# Patient Record
Sex: Male | Born: 1945 | Race: White | Hispanic: No | Marital: Single | Smoking: Former smoker
Health system: Southern US, Community
[De-identification: ages and names within clinical notes are randomized; demographics above are authoritative.]

## PROBLEM LIST (undated history)

## (undated) DIAGNOSIS — Z9289 Personal history of other medical treatment: Secondary | ICD-10-CM

## (undated) DIAGNOSIS — E7800 Pure hypercholesterolemia, unspecified: Secondary | ICD-10-CM

## (undated) DIAGNOSIS — N400 Enlarged prostate without lower urinary tract symptoms: Secondary | ICD-10-CM

## (undated) DIAGNOSIS — E669 Obesity, unspecified: Secondary | ICD-10-CM

## (undated) DIAGNOSIS — E119 Type 2 diabetes mellitus without complications: Secondary | ICD-10-CM

## (undated) DIAGNOSIS — I10 Essential (primary) hypertension: Secondary | ICD-10-CM

## (undated) DIAGNOSIS — I2510 Atherosclerotic heart disease of native coronary artery without angina pectoris: Secondary | ICD-10-CM

## (undated) DIAGNOSIS — I209 Angina pectoris, unspecified: Secondary | ICD-10-CM

## (undated) DIAGNOSIS — M1990 Unspecified osteoarthritis, unspecified site: Secondary | ICD-10-CM

## (undated) DIAGNOSIS — E785 Hyperlipidemia, unspecified: Secondary | ICD-10-CM

## (undated) HISTORY — DX: Essential (primary) hypertension: I10

## (undated) HISTORY — PX: HEMORRHOID SURGERY: SHX153

## (undated) HISTORY — PX: ANKLE FRACTURE SURGERY: SHX122

## (undated) HISTORY — DX: Personal history of other medical treatment: Z92.89

## (undated) HISTORY — DX: Hyperlipidemia, unspecified: E78.5

## (undated) HISTORY — DX: Atherosclerotic heart disease of native coronary artery without angina pectoris: I25.10

---

## 2005-05-05 ENCOUNTER — Emergency Department: Payer: Self-pay | Admitting: Emergency Medicine

## 2008-09-09 ENCOUNTER — Ambulatory Visit: Payer: Self-pay | Admitting: Urology

## 2008-11-26 ENCOUNTER — Inpatient Hospital Stay: Payer: Self-pay | Admitting: Orthopedic Surgery

## 2008-12-27 HISTORY — PX: LEG SURGERY: SHX1003

## 2010-11-25 ENCOUNTER — Ambulatory Visit: Payer: Self-pay | Admitting: Urology

## 2010-12-11 ENCOUNTER — Ambulatory Visit: Payer: Self-pay | Admitting: Urology

## 2010-12-31 ENCOUNTER — Ambulatory Visit: Payer: Self-pay | Admitting: Urology

## 2011-01-07 ENCOUNTER — Ambulatory Visit: Payer: Self-pay | Admitting: Urology

## 2011-01-18 ENCOUNTER — Ambulatory Visit: Payer: Self-pay | Admitting: Urology

## 2011-05-17 ENCOUNTER — Ambulatory Visit: Payer: Self-pay | Admitting: General Practice

## 2011-05-25 ENCOUNTER — Ambulatory Visit: Payer: Self-pay | Admitting: Cardiology

## 2011-06-08 ENCOUNTER — Ambulatory Visit
Admission: RE | Admit: 2011-06-08 | Discharge: 2011-06-08 | Disposition: A | Discharge status: Home / Self Care | Payer: BC Managed Care – PPO | Source: Ambulatory Visit | Attending: Cardiovascular Disease | Admitting: Cardiovascular Disease

## 2011-06-08 ENCOUNTER — Other Ambulatory Visit: Payer: Self-pay | Admitting: Cardiovascular Disease

## 2011-06-14 ENCOUNTER — Ambulatory Visit (HOSPITAL_COMMUNITY)
Admission: RE | Admit: 2011-06-14 | Discharge: 2011-06-15 | Disposition: A | Discharge status: Home / Self Care | Payer: BC Managed Care – PPO | Source: Ambulatory Visit | Attending: Cardiovascular Disease | Admitting: Cardiovascular Disease

## 2011-06-14 DIAGNOSIS — I2510 Atherosclerotic heart disease of native coronary artery without angina pectoris: Secondary | ICD-10-CM | POA: Insufficient documentation

## 2011-06-14 DIAGNOSIS — I2582 Chronic total occlusion of coronary artery: Secondary | ICD-10-CM | POA: Insufficient documentation

## 2011-06-14 DIAGNOSIS — I209 Angina pectoris, unspecified: Secondary | ICD-10-CM | POA: Insufficient documentation

## 2011-06-14 HISTORY — PX: CORONARY ANGIOPLASTY WITH STENT PLACEMENT: SHX49

## 2011-06-15 LAB — BASIC METABOLIC PANEL
BUN: 11 mg/dL (ref 6–23)
CO2: 30 mEq/L (ref 19–32)
Calcium: 8.8 mg/dL (ref 8.4–10.5)
Chloride: 105 mEq/L (ref 96–112)
Creatinine, Ser: 0.95 mg/dL (ref 0.50–1.35)

## 2011-06-15 LAB — CBC
HCT: 38.1 % — ABNORMAL LOW (ref 39.0–52.0)
MCH: 32 pg (ref 26.0–34.0)
MCV: 90.9 fL (ref 78.0–100.0)
Platelets: 190 10*3/uL (ref 150–400)
RBC: 4.19 MIL/uL — ABNORMAL LOW (ref 4.22–5.81)
RDW: 12.6 % (ref 11.5–15.5)

## 2011-06-22 NOTE — Discharge Summary (Signed)
  NAME:  TABARI, VOLKERT NO.:  [id number]  MEDICAL RECORD NO.:  [id number]  LOCATION:  6523                         FACILITY:  [facility]  PHYSICIAN:  Nanetta Batty, M.D.   DATE OF BIRTH:  August 23, 1946  DATE OF ADMISSION:  06/14/2011 DATE OF DISCHARGE:  06/15/2011                              DISCHARGE SUMMARY   DISCHARGE DIAGNOSES: 1. Coronary artery disease status post percutaneous coronary     intervention with chronically occluded left anterior descending     coronary artery with Resolute Integrity stent. 2. Chest pain, resolved, negative EKG changes. 3. Dyslipidemia.  HOSPITAL COURSE:  Mr. Kovacevic is a 65 year old Caucasian male with a history of coronary artery disease with a chronically occluded mid LAD, which was unable to be crossed by Dr. Juliann Pares.  He is set up for percutaneous coronary intervention on June 14, 2011, for an attempt at stenting to the LAD.  This was completed successfully with a Resolute Integrity stent.  The 100% stenosis was reduced to 0%.  The patient will be sent home with aspirin and additional Plavix and follow up with Dr. Lady Gary at the [facility].  DISCHARGE LABS:  WBC 7.2, hemoglobin 13.4, hematocrit 38.1, platelets 190.  Sodium 140, potassium 4.3, chloride 105, carbon dioxide 30, BUN 11, creatinine 0.95, glucose 117, calcium 8.8.  STUDIES/PROCEDURES:  Cardiac catheterization on June 14, 2011, successful percutaneous coronary intervention and stenting of the left anterior descending CTO with chronic stable angina using a drug-eluting stent.  The 100% stenosis was reduced to 0%.  DISCHARGE MEDICATIONS: 1. Plavix 75 mg 1 tab by mouth daily with a meal. 2. Aspirin enteric-coated 325 mg 1 tab by mouth daily. 3. CoQ10 over the counter 1 capsule by mouth daily. 4. Garlic over the counter 1 capsule by mouth daily. 5. Lisinopril 5 mg 1 tab by mouth daily. 6. Metoprolol XL succinate 25 mg 1 tab by mouth daily. 7. Multivitamins 1 tab  by mouth daily. 8. Simvastatin 20 mg 1 tab by mouth daily. 9. Saw palmetto over the counter 1 capsule by mouth daily. 10.Vitamin B 100 mg 1 tab by mouth daily.  DISPOSITION:  Mr. Mabey was discharged home in stable condition.  He was recommended to increase his activity slowly.  May shower and bathe.  No lifting for 3 days greater than 10 pounds, no driving for 3 days.  He was recommended to eat a low-sodium, heart-healthy diet.  If the catheter site becomes red, painful, swollen, or discharges fluid or pus, he is to call our office at [phone].  He will follow up with Dr. Lady Gary at the [facility] he has been instructed to call for an appointment time in approximately 1-2 weeks.    ______________________________ Wilburt Finlay, PA   ______________________________ Nanetta Batty, M.D.    BH/MEDQ  D:  06/15/2011  T:  06/16/2011  Job:  [id number]  cc:   Harold Hedge, MD  Electronically Signed by Wilburt Finlay PA on 06/17/2011 03:18:25 PM Electronically Signed by Nanetta Batty M.D. on 06/22/2011 09:23:13 AM

## 2011-06-22 NOTE — Cardiovascular Report (Signed)
NAME:  Darrell Jennings, Darrell Jennings NO.:  [id number]  MEDICAL RECORD NO.:  [id number]  LOCATION:  6523                         FACILITY:  [facility]  PHYSICIAN:  Nanetta Batty, M.D.   DATE OF BIRTH:  [birth year]  DATE OF PROCEDURE:  06/14/2011 DATE OF DISCHARGE:                           CARDIAC CATHETERIZATION   Darrell Jennings is a 65 year old mildly overweight divorced Caucasian male, father of one, grandfather of 3 grandchildren who was referred through the courtesy of Dr. Lady Gary for attempted PCI and stenting of the LAD chronic total occlusion with chronic stable angina, which failed one attempt at closing recently.  DESCRIPTION OF PROCEDURE:  The patient was brought to the Second Floor [facility] Cardiac Cath Lab in a postabsorptive state.  He was premedicated with p.o. Valium and IV fentanyl.  His right groin was prepped and shaved in usual sterile fashion.  Xylocaine 1% was used for local anesthesia.  A 7-French sheath was inserted into the right femoral artery using standard Seldinger technique.  A 5-French JR-4 was used to image the right coronary artery and establish the extent of right-to- left collateralization.  Visipaque dye was used throughout the entirety of the case.  Retrograde aortic pressures were monitored during the case.  Blood pressures are running in the 120/75 range.  Total of 195 mL was used during the case.  The patient received four baby aspirin, Plavix 600 mg p.o., and Pepcid intravenously.  The ACT was 495 after Angiomax bolus.  Using a 7-French XB LAD 3.5 along with an 0.14 x 190 Choice PT 2 guidewire and a 1.5 x 8-mm long pressure apex balloon, the lesion was crossed with moderate difficulty.  I established the wire, it was intraluminal with a prolonged injection showing that it indeed was in the LAD beyond the CTO.  Following this, I performed multiple inflations using the 158 pressure and then upgraded to a 20/15 track.  Following this, 2.5 x 18  Resolute stent was then deployed and positioned under strict angiographic fluoroscopic control.  It was deployed at 16 atmospheres.  Postdilatation was performed with a 30/15 [place] track at 16 atmospheres resulting in reduction of a total occlusion to 0% residual without TIMI 3 flow.  The patient did receive 200 mcg of intracoronary nitroglycerin.  There were no dissections.  He remained hemodynamically and electrocardiographically stable throughout the case.  IMPRESSION:  Successful percutaneous coronary intervention and stenting of the left anterior descending CTO with chronic stable angina using arrest with drug-eluting stent, aspirin, and Plavix.  The patient tolerated the procedure well.  The sheath will be removed in several hours, so he will remain recumbent for 6 hours thereafter and will be gently hydrated overnight.  He will discharged on the morning and will follow up with Dr. Lady Gary at [facility].  He left the lab in a stable condition. Dr. Lady Gary was notified of these results.     Nanetta Batty, M.D.     JB/MEDQ  D:  06/14/2011  T:  06/15/2011  Job:  [id number]  cc:   Second [facility] Cardiac [facility] Lab [facility] Harold Hedge, MD Jabier Mutton, MD  Electronically Signed by  Nanetta Batty M.D. on 06/22/2011 09:23:10 AM

## 2011-06-26 IMAGING — CT CT STONE STUDY
1 of 2 series · 15 of 32 positions shown, 19 images · non-contrast
Comparison: none

REASON FOR EXAM: COMMENTS:

PROCEDURE:     KREGER - KREGER ABDOMEN/PELVIS WO ( STONE)  - December 31, 2010  [DATE]
RESULT:      Comparison: None
TECHNIQUE: Multiple axial images from the lung bases to the symphysis pubis
were obtained without oral and without intravenous contrast.

[Series 2: soft tissue · axial · 0.84mm/px · z∈[-185,+250]mm · 15 of 159 slices shown, 19 images]
[im 7/159  soft-tissue]
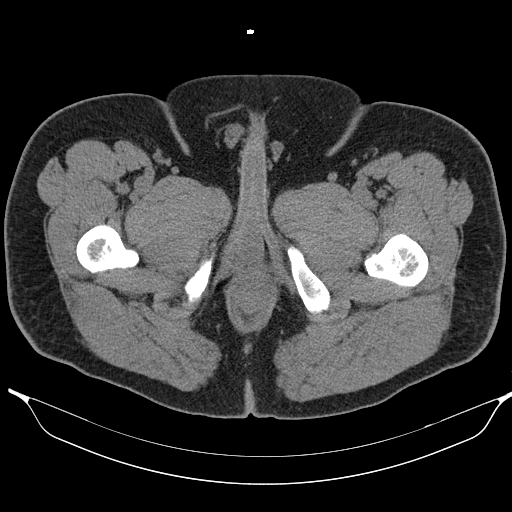
[im 7/159  bone]
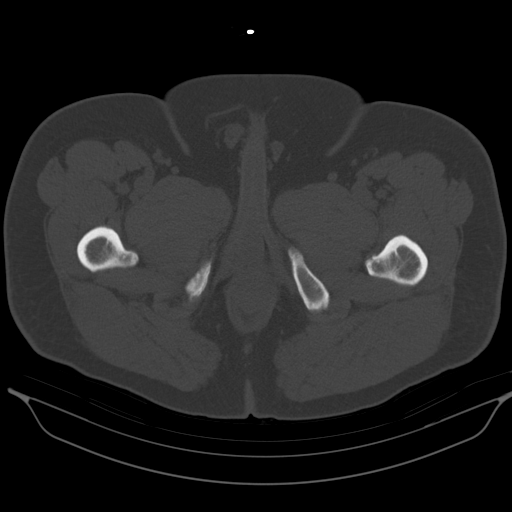
[im 21/159  soft-tissue]
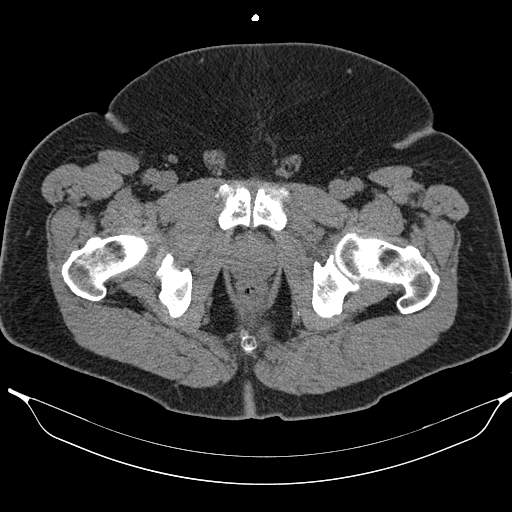
[im 35/159  soft-tissue]
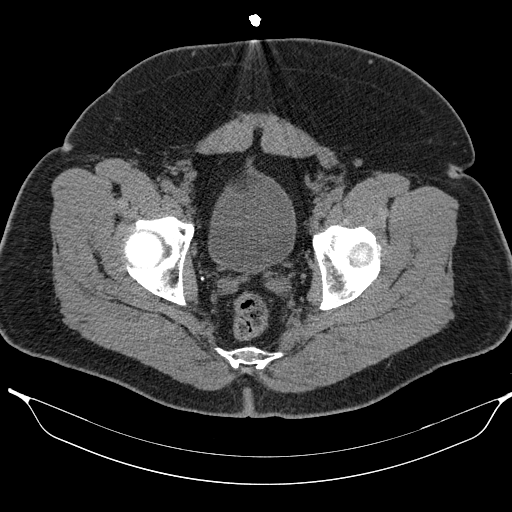
[im 42/159  soft-tissue]
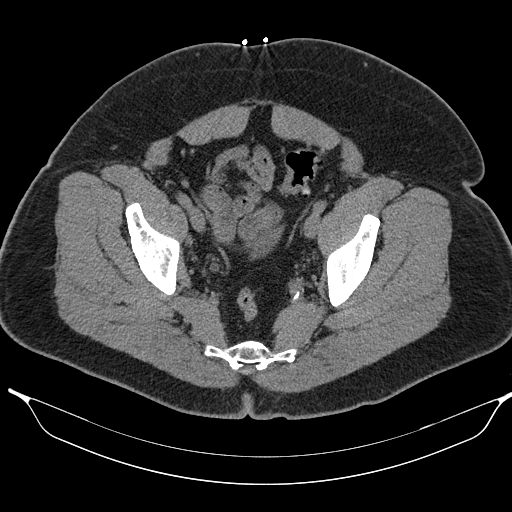
[im 55/159  soft-tissue]
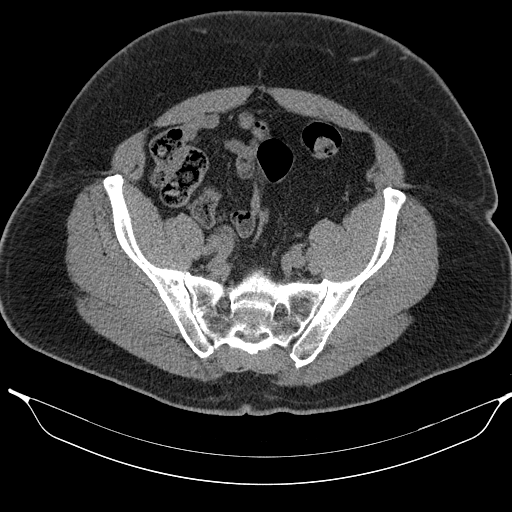
[im 69/159  soft-tissue]
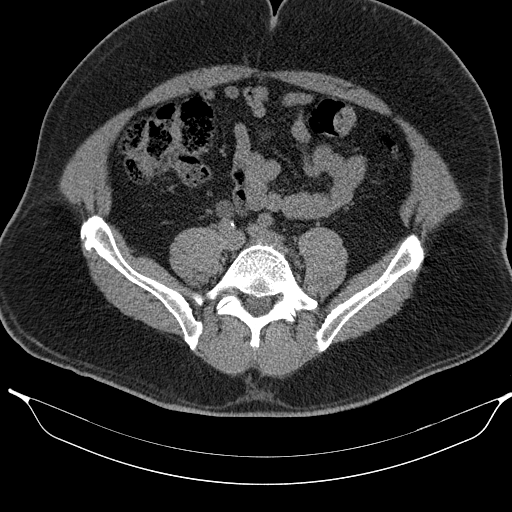
[im 83/159  soft-tissue]
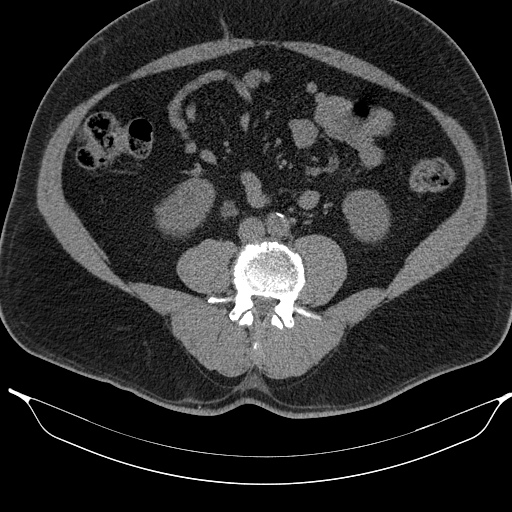
[im 90/159  soft-tissue]
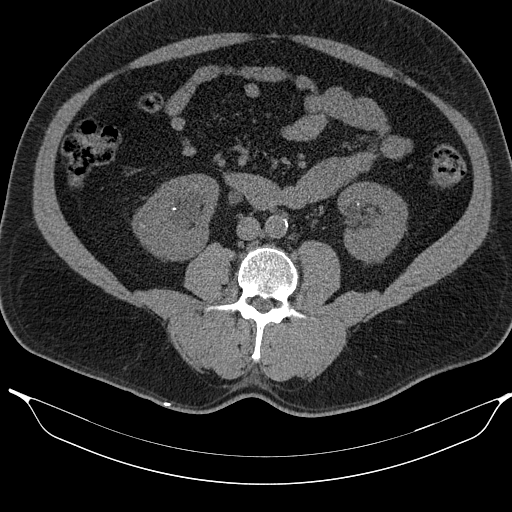
[im 104/159  soft-tissue]
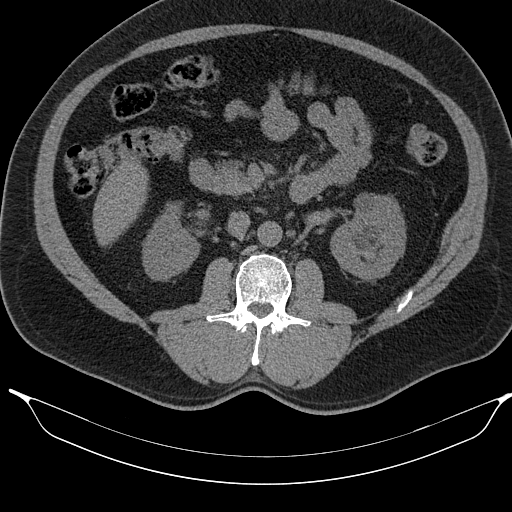
[im 104/159  bone]
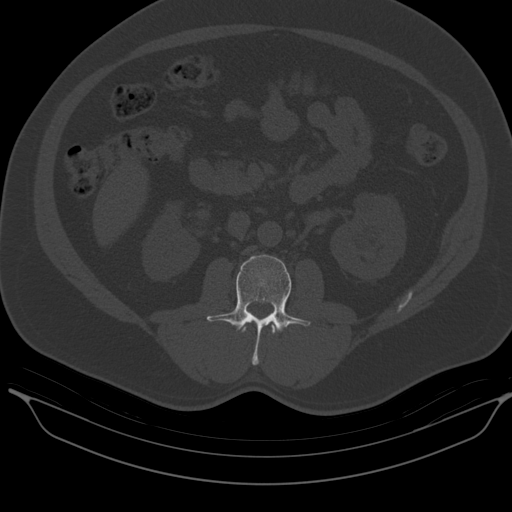
[im 117/159  soft-tissue]
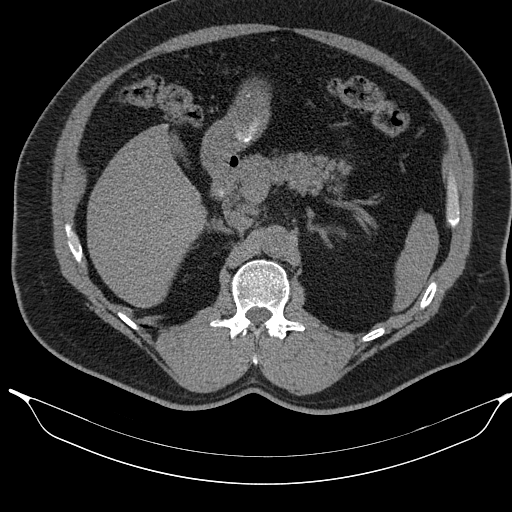
[im 124/159  soft-tissue]
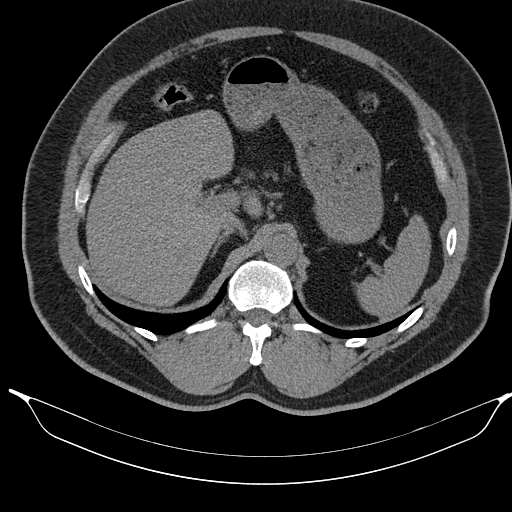
[im 131/159  lung]
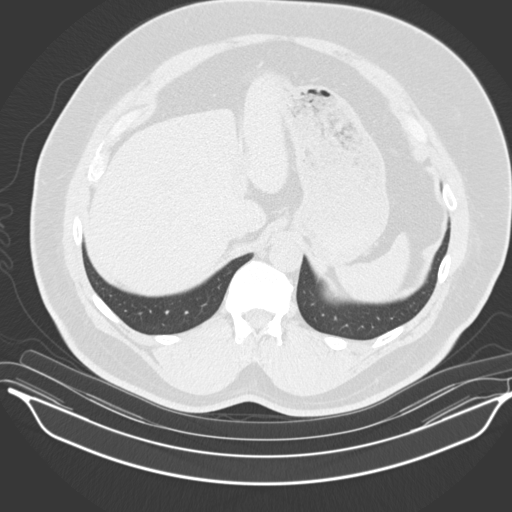
[im 138/159  soft-tissue]
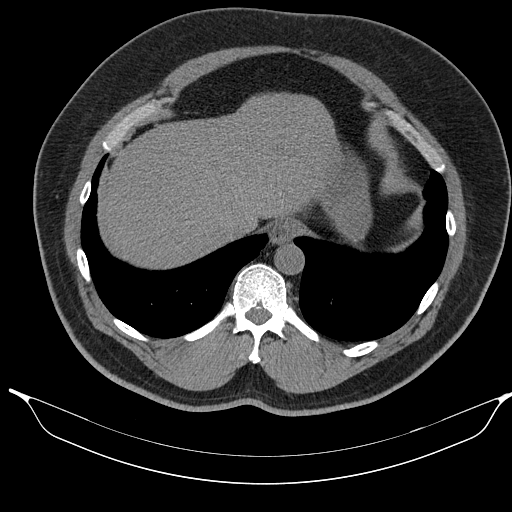
[im 138/159  lung]
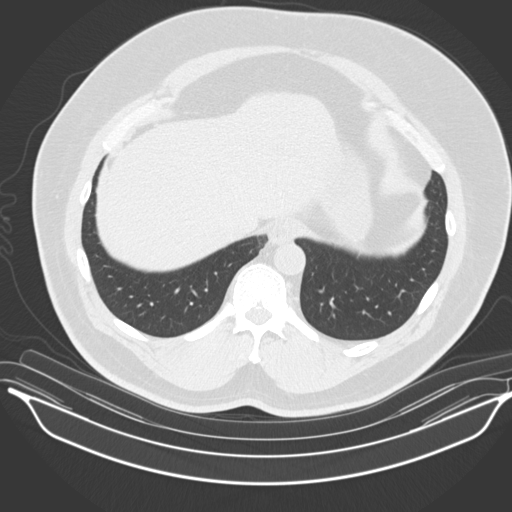
[im 145/159  lung]
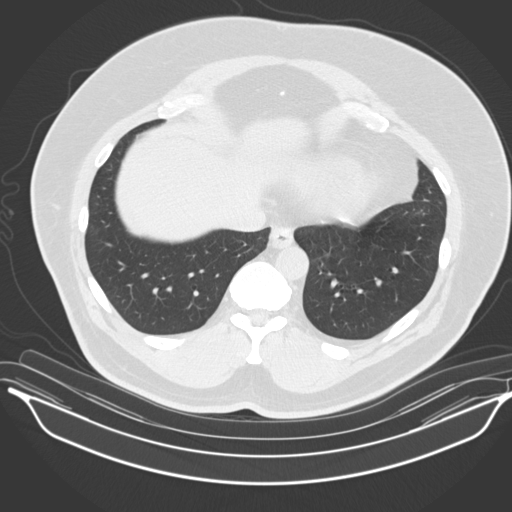
[im 152/159  soft-tissue]
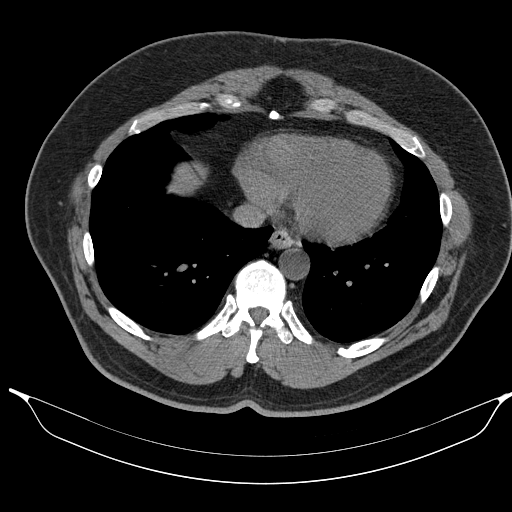
[im 152/159  lung]
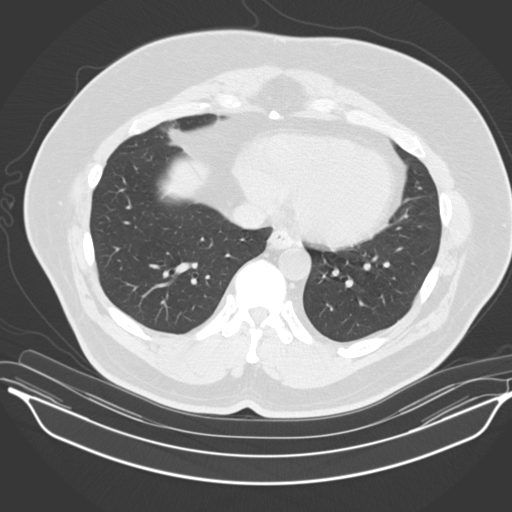

[15 of 32 positions shown; findings below may reference images not displayed]

FINDINGS: Lack of intravenous contrast limits evaluation of the solid abdominal
organs.  Small low-attenuation lesion in left hepatic lobe is too small to
characterize. The gallbladder is decompressed. The spleen, adrenals, and
pancreas are unremarkable. There is moderate right-sided hydronephrosis. A 6
mm calculus is demonstrated in the distal third of the right ureter. There
is moderate proximal ureteral dilatation. Multiple 2 mm calculi are
demonstrated in the right kidney. There is a 1 mm calculus in the left
kidney. Low-attenuation lesions in the left kidney likely represent cysts.

The small and large bowel are normal in caliber. The appendix is not
visualized. However, there are no inflammatory changes in the right lower
quadrant.

No aggressive lytic or sclerotic osseous lesions identified.
IMPRESSION: 6 mm calculus in the distal third of the right ureter causing moderate
proximal obstruction.

This was called to Mich Hauser at 7472 hours 12/31/2010.

## 2011-11-02 DIAGNOSIS — Z9289 Personal history of other medical treatment: Secondary | ICD-10-CM

## 2011-11-02 HISTORY — DX: Personal history of other medical treatment: Z92.89

## 2013-02-09 ENCOUNTER — Emergency Department: Payer: Self-pay | Admitting: Emergency Medicine

## 2013-02-09 LAB — CBC
HCT: 41.6 % (ref 40.0–52.0)
HGB: 14.2 g/dL (ref 13.0–18.0)
MCH: 32.1 pg (ref 26.0–34.0)
MCHC: 34.1 g/dL (ref 32.0–36.0)
MCV: 94 fL (ref 80–100)
Platelet: 219 10*3/uL (ref 150–440)
RBC: 4.41 10*6/uL (ref 4.40–5.90)
RDW: 12.8 % (ref 11.5–14.5)

## 2013-02-09 LAB — COMPREHENSIVE METABOLIC PANEL
Anion Gap: 9 (ref 7–16)
BUN: 16 mg/dL (ref 7–18)
Calcium, Total: 8.9 mg/dL (ref 8.5–10.1)
Co2: 21 mmol/L (ref 21–32)
EGFR (African American): 49 — ABNORMAL LOW
Glucose: 139 mg/dL — ABNORMAL HIGH (ref 65–99)
Osmolality: 274 (ref 275–301)
Potassium: 3.8 mmol/L (ref 3.5–5.1)
SGOT(AST): 31 U/L (ref 15–37)
SGPT (ALT): 45 U/L (ref 12–78)

## 2013-02-09 LAB — URINALYSIS, COMPLETE
Bacteria: NONE SEEN
Glucose,UR: NEGATIVE mg/dL (ref 0–75)
Hyaline Cast: 7
Ketone: NEGATIVE
Leukocyte Esterase: NEGATIVE
Nitrite: NEGATIVE
Protein: NEGATIVE
RBC,UR: 19 /HPF (ref 0–5)
Squamous Epithelial: NONE SEEN

## 2013-05-23 ENCOUNTER — Other Ambulatory Visit: Payer: Self-pay | Admitting: *Deleted

## 2013-05-23 MED ORDER — LISINOPRIL 5 MG PO TABS: 5.0000 mg | ORAL_TABLET | Freq: Every day | ORAL | Status: DC

## 2013-05-28 ENCOUNTER — Other Ambulatory Visit: Payer: Self-pay | Admitting: Cardiovascular Disease

## 2013-06-06 ENCOUNTER — Other Ambulatory Visit: Payer: Self-pay | Admitting: *Deleted

## 2013-06-06 MED ORDER — CLOPIDOGREL BISULFATE 75 MG PO TABS: 75.0000 mg | ORAL_TABLET | Freq: Every day | ORAL | Status: DC

## 2013-07-13 ENCOUNTER — Other Ambulatory Visit: Payer: Self-pay | Admitting: Cardiovascular Disease

## 2013-07-13 ENCOUNTER — Other Ambulatory Visit: Payer: Self-pay | Admitting: *Deleted

## 2013-10-24 ENCOUNTER — Encounter: Payer: Self-pay | Admitting: *Deleted

## 2013-10-26 ENCOUNTER — Ambulatory Visit (INDEPENDENT_AMBULATORY_CARE_PROVIDER_SITE_OTHER): Payer: Medicare Other | Admitting: Cardiovascular Disease

## 2013-10-26 ENCOUNTER — Encounter: Payer: Self-pay | Admitting: Cardiovascular Disease

## 2013-10-26 ENCOUNTER — Telehealth: Payer: Self-pay | Admitting: *Deleted

## 2013-10-26 VITALS — BP 144/86 | HR 63 | Ht 70.0 in | Wt 274.3 lb

## 2013-10-26 DIAGNOSIS — I2510 Atherosclerotic heart disease of native coronary artery without angina pectoris: Secondary | ICD-10-CM | POA: Insufficient documentation

## 2013-10-26 DIAGNOSIS — Z79899 Other long term (current) drug therapy: Secondary | ICD-10-CM

## 2013-10-26 DIAGNOSIS — I10 Essential (primary) hypertension: Secondary | ICD-10-CM

## 2013-10-26 DIAGNOSIS — Z01818 Encounter for other preprocedural examination: Secondary | ICD-10-CM

## 2013-10-26 DIAGNOSIS — E785 Hyperlipidemia, unspecified: Secondary | ICD-10-CM

## 2013-10-26 NOTE — Progress Notes (Signed)
10/26/2013 Darrell Jennings   1946/01/25  [id number]  Primary Physician No PCP Per Patient Primary Cardiologist: Runell Gess MD Roseanne Reno   HPI:  The patient is a 67 year old moderately overweight divorced Caucasian male, father of 1, grandfather to 3 grandchildren, who I last saw in the office 8 months ago. He was initially referred to me by Dr. Mariel Kansky at [facility] for treatment of an LAD CTO, which I successfully opened with a Resolute drug-eluting stent. Since that time, he has seen me back in followup. His other problems include hypertension and hyperlipidemia. His symptoms resolved after this intervention, and his Myoview performed November 02, 2011, showed no ischemia. It has been over 2 years since his last lipid profile.since I saw him last 01/01/13 he had developed some occasional substernal chest pain. He scheduled for extensive sinus surgery in the upcoming future. I'm going to get an exercise Myoview stress test to rule out progression of disease at the preoperatively clear him. I believe it would be safe to interrupt his dual antibiotic therapy for any perioperative period.    Current Outpatient Prescriptions  Medication Sig Dispense Refill  . aspirin 81 MG tablet Take 81 mg by mouth daily.      . B Complex Vitamins (B COMPLEX 100 PO) Take 1 capsule by mouth.      . clopidogrel (PLAVIX) 75 MG tablet Take 1 tablet (75 mg total) by mouth daily.  30 tablet  6  . Coenzyme Q10 (CO Q 10) 100 MG CAPS Take 1 capsule by mouth daily.      . fish oil-omega-3 fatty acids 1000 MG capsule Take 1 g by mouth daily.      Marland Kitchen GARLIC PO Take by mouth daily.      Marland Kitchen lisinopril (PRINIVIL,ZESTRIL) 5 MG tablet TAKE 1 TABLET BY MOUTH EVERY DAY  30 tablet  6  . metoprolol succinate (TOPROL-XL) 25 MG 24 hr tablet Take 25 mg by mouth daily.      . Multiple Vitamin (MULTIVITAMIN WITH MINERALS) TABS Take 1 tablet by mouth daily.      . saw palmetto 160 MG capsule Take 160 mg by mouth  daily.      . simvastatin (ZOCOR) 20 MG tablet Take 20 mg by mouth at bedtime.      . Triamcinolone Acetonide (NASACORT ALLERGY 24HR NA) Place into the nose as needed.       No current facility-administered medications for this visit.    No Known Allergies  History   Social History  . Marital Status: Single    Spouse Name: N/A    Number of Children: 1  . Years of Education: N/A   Occupational History  . Not on file.   Social History Main Topics  . Smoking status: Former Smoker    Quit date: 12/27/1973  . Smokeless tobacco: Not on file  . Alcohol Use: Yes     Comment: beer  every once in while  . Drug Use: No  . Sexual Activity: Not on file   Other Topics Concern  . Not on file   Social History Narrative  . No narrative on file     Review of Systems: General: negative for chills, fever, night sweats or weight changes.  Cardiovascular: negative for chest pain, dyspnea on exertion, edema, orthopnea, palpitations, paroxysmal nocturnal dyspnea or shortness of breath Dermatological: negative for rash Respiratory: negative for cough or wheezing Urologic: negative for hematuria Abdominal: negative for nausea, vomiting, diarrhea,  bright red blood per rectum, melena, or hematemesis Neurologic: negative for visual changes, syncope, or dizziness All other systems reviewed and are otherwise negative except as noted above.    Blood pressure 144/86, pulse 63, height 5\' 10"  (1.778 m), weight 274 lb 4.8 oz (124.422 kg).  General appearance: alert and no distress Neck: no adenopathy, no carotid bruit, no JVD, supple, symmetrical, trachea midline and thyroid not enlarged, symmetric, no tenderness/mass/nodules Lungs: clear to auscultation bilaterally Heart: regular rate and rhythm, S1, S2 normal, no murmur, click, rub or gallop Extremities: extremities normal, atraumatic, no cyanosis or edema  EKG normal sinus rhythm at 63 without ST or T wave changes. There were septal Q waves  noted.  ASSESSMENT AND PLAN:   Coronary artery disease Status post PCI and stenting of mid LAD chronic total occlusion 06/14/11 with a resolute drug-eluting stent. The remainder of his anatomy was unremarkable and he had normal LV function. Followup Myoview stress test performed 11/02/11 showed a perfusion in the anterior wall without evidence of ischemia or scar. He has noticed some off-and-on chest pain the last several months. He scheduled for extensive sinus surgery. He is on dual antibiotic therapy. I'm going to get an exercise Myoview stress test to rule out aggression of disease. I believe he would be at low risk for stopping his Plavix during the perioperative period..  Essential hypertension Under good control on current medications  Hyperlipidemia On statin therapy. I will recheck a lipid and liver profile      Runell Gess MD [facility], [facility] 10/26/2013 9:33 AM

## 2013-10-26 NOTE — Telephone Encounter (Signed)
placed

## 2013-10-26 NOTE — Assessment & Plan Note (Signed)
Status post PCI and stenting of mid LAD chronic total occlusion 06/14/11 with a resolute drug-eluting stent. The remainder of his anatomy was unremarkable and he had normal LV function. Followup Myoview stress test performed 11/02/11 showed a perfusion in the anterior wall without evidence of ischemia or scar. He has noticed some off-and-on chest pain the last several months. He scheduled for extensive sinus surgery. He is on dual antibiotic therapy. I'm going to get an exercise Myoview stress test to rule out aggression of disease. I believe he would be at low risk for stopping his Plavix during the perioperative period.Marland Kitchen

## 2013-10-26 NOTE — Patient Instructions (Signed)
  Your physician wants you to follow-up with him in : 1 year                                                                You will receive a reminder letter in the mail one month in advance. If you don't receive a letter, please call our office to schedule the follow-up appointment.   Your physician recommends that you return for lab work in: 1-2 weeks at your convenience, fasting    Your physician has ordered the following tests: exercise myoview

## 2013-10-26 NOTE — Assessment & Plan Note (Signed)
Under good control on current medications 

## 2013-10-26 NOTE — Assessment & Plan Note (Signed)
On statin therapy. I will recheck a lipid and liver profile 

## 2013-10-30 LAB — LIPID PANEL
Cholesterol: 131 mg/dL (ref 0–200)
Total CHOL/HDL Ratio: 4 Ratio

## 2013-10-30 LAB — HEPATIC FUNCTION PANEL
ALT: 29 U/L (ref 0–53)
AST: 21 U/L (ref 0–37)
Bilirubin, Direct: 0.2 mg/dL (ref 0.0–0.3)
Indirect Bilirubin: 1 mg/dL — ABNORMAL HIGH (ref 0.0–0.9)

## 2013-11-07 ENCOUNTER — Ambulatory Visit (HOSPITAL_COMMUNITY)
Admission: RE | Admit: 2013-11-07 | Discharge: 2013-11-07 | Disposition: A | Discharge status: Home / Self Care | Payer: Medicare Other | Source: Ambulatory Visit | Attending: Cardiology | Admitting: Cardiology

## 2013-11-07 DIAGNOSIS — Z9861 Coronary angioplasty status: Secondary | ICD-10-CM | POA: Insufficient documentation

## 2013-11-07 DIAGNOSIS — E669 Obesity, unspecified: Secondary | ICD-10-CM | POA: Insufficient documentation

## 2013-11-07 DIAGNOSIS — I10 Essential (primary) hypertension: Secondary | ICD-10-CM | POA: Insufficient documentation

## 2013-11-07 DIAGNOSIS — I2510 Atherosclerotic heart disease of native coronary artery without angina pectoris: Secondary | ICD-10-CM | POA: Insufficient documentation

## 2013-11-07 DIAGNOSIS — R0609 Other forms of dyspnea: Secondary | ICD-10-CM | POA: Insufficient documentation

## 2013-11-07 DIAGNOSIS — R0602 Shortness of breath: Secondary | ICD-10-CM | POA: Insufficient documentation

## 2013-11-07 DIAGNOSIS — Z01818 Encounter for other preprocedural examination: Secondary | ICD-10-CM

## 2013-11-07 DIAGNOSIS — R0989 Other specified symptoms and signs involving the circulatory and respiratory systems: Secondary | ICD-10-CM | POA: Insufficient documentation

## 2013-11-07 DIAGNOSIS — R42 Dizziness and giddiness: Secondary | ICD-10-CM | POA: Insufficient documentation

## 2013-11-07 MED ORDER — TECHNETIUM TC 99M SESTAMIBI GENERIC - CARDIOLITE
29.9000 | Freq: Once | INTRAVENOUS | Status: AC | PRN
  Administered 2013-11-07: 29.9 via INTRAVENOUS

## 2013-11-07 MED ORDER — TECHNETIUM TC 99M SESTAMIBI GENERIC - CARDIOLITE
10.6000 | Freq: Once | INTRAVENOUS | Status: AC | PRN
  Administered 2013-11-07: 11 via INTRAVENOUS

## 2013-11-07 NOTE — Procedures (Addendum)
[facility] [address] [place] [address] [place] [address] [phone]  Cardiology Nuclear Med Study  Darrell Jennings is a 67 y.o. male     MRN : [id number]     DOB: 01-15-1946  Procedure Date: 11/07/2013  Nuclear Med Background Indication for Stress Test:  Evaluation for Ischemia and Surgical Clearance History:  CAD;STENT/PTCA--06/14/2011 Cardiac Risk Factors: Family History - CAD, History of Smoking, Hypertension, Lipids and Obesity  Symptoms:  Dizziness   Nuclear Pre-Procedure Caffeine/Decaff Intake:  1:00am NPO After: 11AM   IV Site: R Hand  IV 0.9% NS with Angio Cath:  22g  Chest Size (in):  48"  IV Started by: Emmit Pomfret, RN  Height: 5\' 10"  (1.778 m)  Cup Size: n/a  BMI:  Body mass index is 39.32 kg/(m^2). Weight:  274 lb (124.286 kg)   Tech Comments:  N/A    Nuclear Med Study 1 or 2 day study: 1 day  Stress Test Type:  Lexiscan  Order Authorizing Provider:  Nanetta Batty, MD   Resting Radionuclide: Technetium 62m Sestamibi  Resting Radionuclide Dose: 10.6 mCi   Stress Radionuclide:  Technetium 1m Sestamibi  Stress Radionuclide Dose: 29.9 mCi           Stress Protocol Rest HR: 65 Stress HR: 146  Rest BP: 134/78 Stress BP: 172/92  Exercise Time (min): 8:30 METS: 10.1   Predicted Max HR: 155 bpm % Max HR: 94.19 bpm Rate Pressure Product: 46962  Dose of Adenosine (mg):  n/a Dose of Lexiscan: n/a mg  Dose of Atropine (mg): n/a Dose of Dobutamine: n/a mcg/kg/min (at max HR)  Stress Test Technologist: Esperanza Sheets, CCT Nuclear Technologist: Gonzella Lex, CNMT   Rest Procedure:  Myocardial perfusion imaging was performed at rest 45 minutes following the intravenous administration of Technetium 85m Sestamibi. Stress Procedure:  The patient performed treadmill exercise using a Bruce  Protocol for 8:30 minutes. The patient stopped due to SOB and denied any chest pain.  There were no significant ST-T wave changes.   Technetium 24m Sestamibi was injected at peak exercise and myocardial perfusion imaging was performed after a brief delay.  Transient Ischemic Dilatation (Normal <1.22):  0.75 Lung/Heart Ratio (Normal <0.45):  0.39 QGS EDV:  78 ml QGS ESV:  28 ml LV Ejection Fraction: 64%  Rest ECG: NSR - Normal EKG  Stress ECG: No significant ST segment change suggestive of ischemia.  QPS Raw Data Images:  Normal; no motion artifact; normal heart/lung ratio. Stress Images:  Fixed distal anteroseptal defect. Rest Images:  Fixed distal anteroseptal defect. Subtraction (SDS):  No evidence of ischemia.  Impression Exercise Capacity:  Good exercise capacity. BP Response:  Normal blood pressure response. Clinical Symptoms:  dyspnea ECG Impression:  No significant ST segment change suggestive of ischemia. Comparison with Prior Nuclear Study: No significant change from previous study  Overall Impression:  Low risk stress nuclear study with fixed distal anteroseptal artifact. Unchanged from prior study in 2012. No ischemia.  LV Wall Motion:  NL LV Function; NL Wall Motion; EF 64%  Chrystie Nose, MD, [facility] Board Certified in Nuclear Cardiology Attending Cardiologist [facility]  Chrystie Nose, MD  11/07/2013 5:37 PM

## 2013-11-14 ENCOUNTER — Encounter: Payer: Self-pay | Admitting: *Deleted

## 2013-11-14 ENCOUNTER — Telehealth: Payer: Self-pay | Admitting: Cardiovascular Disease

## 2013-11-14 NOTE — Telephone Encounter (Signed)
Returning your call. °

## 2013-11-15 NOTE — Telephone Encounter (Signed)
Spoke with patient and gave him the results of his labs and myoview

## 2013-11-15 NOTE — Telephone Encounter (Signed)
Returning your call. °

## 2013-11-25 ENCOUNTER — Other Ambulatory Visit: Payer: Self-pay | Admitting: Cardiovascular Disease

## 2013-11-26 NOTE — Telephone Encounter (Signed)
Rx was sent to pharmacy electronically. 

## 2013-11-29 ENCOUNTER — Ambulatory Visit: Payer: Self-pay | Admitting: Otolaryngology

## 2014-01-28 ENCOUNTER — Other Ambulatory Visit: Payer: Self-pay | Admitting: Cardiovascular Disease

## 2014-01-28 NOTE — Telephone Encounter (Signed)
Rx was sent to pharmacy electronically. 

## 2014-04-28 ENCOUNTER — Other Ambulatory Visit: Payer: Self-pay | Admitting: Cardiovascular Disease

## 2014-04-30 NOTE — Telephone Encounter (Signed)
Rx was sent to pharmacy electronically. 

## 2014-09-05 ENCOUNTER — Telehealth: Payer: Self-pay | Admitting: Cardiovascular Disease

## 2014-09-09 NOTE — Telephone Encounter (Signed)
Closed encounter °

## 2014-09-30 ENCOUNTER — Other Ambulatory Visit: Payer: Self-pay | Admitting: Cardiovascular Disease

## 2014-09-30 NOTE — Telephone Encounter (Signed)
Rx was sent to pharmacy electronically. OV 11/3  

## 2014-10-16 ENCOUNTER — Other Ambulatory Visit: Payer: Self-pay | Admitting: Cardiovascular Disease

## 2014-10-17 NOTE — Telephone Encounter (Signed)
Rx was sent to pharmacy electronically. OV 11/3  

## 2014-10-29 ENCOUNTER — Encounter: Payer: Self-pay | Admitting: Cardiovascular Disease

## 2014-10-29 ENCOUNTER — Ambulatory Visit (INDEPENDENT_AMBULATORY_CARE_PROVIDER_SITE_OTHER): Payer: Medicare Other | Admitting: Cardiovascular Disease

## 2014-10-29 VITALS — BP 122/82 | HR 57 | Ht 70.0 in | Wt 264.9 lb

## 2014-10-29 DIAGNOSIS — I2583 Coronary atherosclerosis due to lipid rich plaque: Principal | ICD-10-CM

## 2014-10-29 DIAGNOSIS — I2510 Atherosclerotic heart disease of native coronary artery without angina pectoris: Secondary | ICD-10-CM

## 2014-10-29 DIAGNOSIS — I10 Essential (primary) hypertension: Secondary | ICD-10-CM

## 2014-10-29 DIAGNOSIS — E785 Hyperlipidemia, unspecified: Secondary | ICD-10-CM

## 2014-10-29 NOTE — Patient Instructions (Signed)
Your physician wants you to follow-up in: 1 year with Dr Berry. You will receive a reminder letter in the mail two months in advance. If you don't receive a letter, please call our office to schedule the follow-up appointment.  

## 2014-10-29 NOTE — Assessment & Plan Note (Signed)
Controlled on current medications 

## 2014-10-29 NOTE — Progress Notes (Signed)
10/29/2014 Darrell Jennings   01/23/1946  [id number]  Primary Physician No PCP Per Patient Primary Cardiologist: Darrell Jennings J. Latina Frank MD Darrell Jennings,Darrell Jennings,Darrell Jennings, Darrell Jennings   HPI:  The patient is a 68 year old moderately overweight divorced Caucasian male, father of 1, grandfather to 3 grandchildren, who I last saw in the office 12 months ago. He was initially referred to me by [facility]. [facility]iel [facility] Jennings [facility] for treatment of an LAD CTO, which I successfully opened with a Resolute drug-eluting stent. Since that time, he has seen me back in followup. His other problems include hypertension and hyperlipidemia. His symptoms resolved after this intervention, and his Myoview performed November 02, 2011, showed no ischemia.  He  Was scheduled for extensive sinus surgery late last year and I performed a Myoview stress test on him that was essentially normal.his lipid profile followed by his primary care physician performed 10/23/14 revealed a total cholesterol 116, LDL of 65 and HDL of 31.  Current Outpatient Prescriptions  Medication Sig Dispense Refill  . arginine 500 MG tablet Take 4 tablets by mouth daily.    Marland Kitchen. aspirin 325 MG tablet Take 1 tablet by mouth daily.    Marland Kitchen. aspirin 81 MG tablet Take 81 mg by mouth daily.    . B Complex Vitamins (B COMPLEX 100 PO) Take 1 capsule by mouth.    . clopidogrel (PLAVIX) 75 MG tablet TAKE 1 TABLET BY MOUTH EVERY DAY 30 tablet 1  . Coenzyme Q10 (CO Q 10) 100 MG CAPS Take 1 capsule by mouth daily.    Marland Kitchen. DHEA 25 MG CAPS Take 1 tablet by mouth daily.    . fish oil-omega-3 fatty acids 1000 MG capsule Take 1 g by mouth daily.    Marland Kitchen. GARLIC PO Take by mouth daily.    Marland Kitchen. lisinopril (PRINIVIL,ZESTRIL) 5 MG tablet TAKE 1 TABLET BY MOUTH EVERY DAY 30 tablet 1  . metoprolol succinate (TOPROL-XL) 25 MG 24 hr tablet TAKE 1 TABLET BY MOUTH EVERY DAY 30 tablet 0  . Multiple Vitamin (MULTIVITAMIN WITH MINERALS) TABS Take 1 tablet by mouth daily.    . saw palmetto 160 MG capsule Take 160 mg by  mouth daily.    . Selenium 100 MCG TABS Take 1 tablet by mouth daily.    . simvastatin (ZOCOR) 20 MG tablet TAKE 1 TABLET BY MOUTH EVERY NIGHT AT BEDTIME 30 tablet 0  . Triamcinolone Acetonide (NASACORT ALLERGY 24HR NA) Place into the nose as needed.     No current facility-administered medications for this visit.    No Known Allergies  History   Social History  . Marital Status: Single    Spouse Name: N/A    Number of Children: 1  . Years of Education: N/A   Occupational History  . Not on file.   Social History Main Topics  . Smoking status: Former Smoker    Quit date: 12/27/1973  . Smokeless tobacco: Not on file  . Alcohol Use: Yes     Comment: beer  every once in while  . Drug Use: No  . Sexual Activity: Not on file   Other Topics Concern  . Not on file   Social History Narrative     Review of Systems: General: negative for chills, fever, night sweats or weight changes.  Cardiovascular: negative for chest pain, dyspnea on exertion, edema, orthopnea, palpitations, paroxysmal nocturnal dyspnea or shortness of breath Dermatological: negative for rash Respiratory: negative for cough or wheezing Urologic: negative for hematuria Abdominal:  negative for nausea, vomiting, diarrhea, bright red blood per rectum, melena, or hematemesis Neurologic: negative for visual changes, syncope, or dizziness All other systems reviewed and are otherwise negative except as noted above.    Blood pressure 122/82, pulse 57, height 5\' 10"  (1.778 m), weight 264 lb 14.4 oz (120.158 kg).  General appearance: alert and no distress Neck: no adenopathy, no carotid bruit, no JVD, supple, symmetrical, trachea midline and thyroid not enlarged, symmetric, no tenderness/mass/nodules Lungs: clear to auscultation bilaterally Heart: regular rate and rhythm, S1, S2 normal, no murmur, click, rub or gallop Extremities: extremities normal, atraumatic, no cyanosis or edema  EKG sinus bradycardia at 57  with septal Q waves  ASSESSMENT AND PLAN:   Coronary artery disease History of CAD status post LAD chronic total occlusion recanalization by myself with a drug-eluting stent (resolute) 06/14/11. A follow-up Myoview performed 12/02/11 was nonischemic as well as one performed 11/07/13 for preoperative clearance prior to sinus surgery which was nonischemic as well. He remains asymptomatic.  Essential hypertension Controlled on current medications  Hyperlipidemia On statin therapy with his most recent lipid profile followed by his primary care physician on 10/21/14 revealing a total cholesterol of 116, LDL of 65 and HDL of 31.      Darrell Jennings J. Darrell Gheen MD Jennings,Darrell Jennings,Darrell Jennings, [facility]rell Jennings [facility] 10/29/2014 10:05 AM

## 2014-10-29 NOTE — Assessment & Plan Note (Signed)
On statin therapy with his most recent lipid profile followed by his primary care physician on 10/21/14 revealing a total cholesterol of 116, LDL of 65 and HDL of 31.

## 2014-10-29 NOTE — Assessment & Plan Note (Signed)
History of CAD status post LAD chronic total occlusion recanalization by myself with a drug-eluting stent (resolute) 06/14/11. A follow-up Myoview performed 12/02/11 was nonischemic as well as one performed 11/07/13 for preoperative clearance prior to sinus surgery which was nonischemic as well. He remains asymptomatic.

## 2014-11-14 ENCOUNTER — Other Ambulatory Visit: Payer: Self-pay | Admitting: Cardiovascular Disease

## 2014-11-14 NOTE — Telephone Encounter (Signed)
Rx has been sent to the pharmacy electronically. ° °

## 2014-11-18 ENCOUNTER — Other Ambulatory Visit: Payer: Self-pay | Admitting: Cardiovascular Disease

## 2014-11-19 NOTE — Telephone Encounter (Signed)
Rx refill sent to patient pharmacy   

## 2015-05-22 ENCOUNTER — Telehealth: Payer: Self-pay | Admitting: *Deleted

## 2015-05-22 NOTE — Telephone Encounter (Signed)
Requesting surgical clearance:   1. Type of surgery: colonoscopy  2. Surgeon: Dr Marva PandaSkulskie  3. Surgical date: 06/16/15  4. Medications that need to be help: Plavix  5. CAD: Yes status post LAD chronic total occlusion recanalization by myself with a drug-eluting stent (resolute) 06/14/11. A follow-up Myoview performed 12/02/11 was nonischemic as well as one performed 11/07/13         EF: 64% per myoview  6. I will defer to: Dr Allyson SabalBerry

## 2015-05-28 NOTE — Telephone Encounter (Signed)
Can hold his antiplatelets therapy for colonoscopy and restarted afterwards

## 2015-05-29 NOTE — Telephone Encounter (Signed)
Encounter routed to [facility] [phone]

## 2015-06-13 ENCOUNTER — Encounter: Payer: Self-pay | Admitting: *Deleted

## 2015-06-16 ENCOUNTER — Ambulatory Visit: Payer: Medicare Other | Admitting: Anesthesiology

## 2015-06-16 ENCOUNTER — Encounter: Payer: Self-pay | Admitting: *Deleted

## 2015-06-16 ENCOUNTER — Encounter
Admission: RE | Disposition: A | Discharge status: Home Health | Payer: Self-pay | Source: Ambulatory Visit | Attending: Gastroenterology

## 2015-06-16 ENCOUNTER — Ambulatory Visit
Admission: RE | Admit: 2015-06-16 | Discharge: 2015-06-16 | Disposition: A | Discharge status: Home Health | Payer: Medicare Other | Source: Ambulatory Visit | Attending: Gastroenterology | Admitting: Gastroenterology

## 2015-06-16 DIAGNOSIS — I2510 Atherosclerotic heart disease of native coronary artery without angina pectoris: Secondary | ICD-10-CM | POA: Diagnosis not present

## 2015-06-16 DIAGNOSIS — Z7902 Long term (current) use of antithrombotics/antiplatelets: Secondary | ICD-10-CM | POA: Diagnosis not present

## 2015-06-16 DIAGNOSIS — E669 Obesity, unspecified: Secondary | ICD-10-CM | POA: Insufficient documentation

## 2015-06-16 DIAGNOSIS — K5730 Diverticulosis of large intestine without perforation or abscess without bleeding: Secondary | ICD-10-CM | POA: Insufficient documentation

## 2015-06-16 DIAGNOSIS — Z7982 Long term (current) use of aspirin: Secondary | ICD-10-CM | POA: Diagnosis not present

## 2015-06-16 DIAGNOSIS — E119 Type 2 diabetes mellitus without complications: Secondary | ICD-10-CM | POA: Insufficient documentation

## 2015-06-16 DIAGNOSIS — N400 Enlarged prostate without lower urinary tract symptoms: Secondary | ICD-10-CM | POA: Diagnosis not present

## 2015-06-16 DIAGNOSIS — E785 Hyperlipidemia, unspecified: Secondary | ICD-10-CM | POA: Diagnosis not present

## 2015-06-16 DIAGNOSIS — Z79899 Other long term (current) drug therapy: Secondary | ICD-10-CM | POA: Insufficient documentation

## 2015-06-16 DIAGNOSIS — Z955 Presence of coronary angioplasty implant and graft: Secondary | ICD-10-CM | POA: Insufficient documentation

## 2015-06-16 DIAGNOSIS — Z6837 Body mass index (BMI) 37.0-37.9, adult: Secondary | ICD-10-CM | POA: Diagnosis not present

## 2015-06-16 DIAGNOSIS — E780 Pure hypercholesterolemia: Secondary | ICD-10-CM | POA: Diagnosis not present

## 2015-06-16 DIAGNOSIS — Z1211 Encounter for screening for malignant neoplasm of colon: Secondary | ICD-10-CM | POA: Insufficient documentation

## 2015-06-16 DIAGNOSIS — I10 Essential (primary) hypertension: Secondary | ICD-10-CM | POA: Diagnosis not present

## 2015-06-16 HISTORY — DX: Pure hypercholesterolemia, unspecified: E78.00

## 2015-06-16 HISTORY — PX: COLONOSCOPY WITH PROPOFOL: SHX5780

## 2015-06-16 HISTORY — DX: Type 2 diabetes mellitus without complications: E11.9

## 2015-06-16 HISTORY — DX: Obesity, unspecified: E66.9

## 2015-06-16 HISTORY — DX: Benign prostatic hyperplasia without lower urinary tract symptoms: N40.0

## 2015-06-16 SURGERY — COLONOSCOPY WITH PROPOFOL
Anesthesia: General

## 2015-06-16 MED ORDER — LACTATED RINGERS IV SOLN
INTRAVENOUS | Status: DC | PRN
  Administered 2015-06-16: 15:00:00 via INTRAVENOUS

## 2015-06-16 MED ORDER — PROPOFOL INFUSION 10 MG/ML OPTIME
INTRAVENOUS | Status: DC | PRN
  Administered 2015-06-16: 125 ug/kg/min via INTRAVENOUS

## 2015-06-16 MED ORDER — SODIUM CHLORIDE 0.9 % IV SOLN: INTRAVENOUS | Status: DC

## 2015-06-16 MED ORDER — PROPOFOL 10 MG/ML IV BOLUS
INTRAVENOUS | Status: DC | PRN
  Administered 2015-06-16: 80 mg via INTRAVENOUS

## 2015-06-16 NOTE — H&P (Signed)
Outpatient short stay form Pre-procedure 06/16/2015 2:36 PM Darrell Deem MD  Primary Physician: Dr. Burnadette Pop  Reason for visit:  Screening colonoscopy  History of present illness:  Patient is a 69 year old male for a screening colonoscopy. This is in regards to age-related colon cancer risk. He's never had a full colonoscopy. He has had a flexible sigmoidoscopy about 15 years ago by one of his other primary care physicians. He tolerated his prep well. He does take aspirin and Plavix for his history of drug-eluting stent placement/ coronary artery disease. He stopped those I days ago.    Current facility-administered medications:  .  0.9 %  sodium chloride infusion, , Intravenous, Continuous, Darrell Deem, MD .  0.9 %  sodium chloride infusion, , Intravenous, Continuous, Darrell Deem, MD  Prescriptions prior to admission  Medication Sig Dispense Refill Last Dose  . arginine 500 MG tablet Take 4 tablets by mouth daily.   Past Week at Unknown time  . aspirin 81 MG tablet Take 81 mg by mouth daily.   Past Week at Unknown time  . B Complex Vitamins (B COMPLEX 100 PO) Take 1 capsule by mouth.   Past Week at Unknown time  . clopidogrel (PLAVIX) 75 MG tablet TAKE 1 TABLET BY MOUTH EVERY DAY 30 tablet 11 Past Week at Unknown time  . Coenzyme Q10 (CO Q 10) 100 MG CAPS Take 1 capsule by mouth daily.   Past Week at Unknown time  . DHEA 25 MG CAPS Take 1 tablet by mouth daily.   Past Week at Unknown time  . fish oil-omega-3 fatty acids 1000 MG capsule Take 1 g by mouth daily.   Past Week at Unknown time  . GARLIC PO Take by mouth daily.   Past Week at Unknown time  . lisinopril (PRINIVIL,ZESTRIL) 5 MG tablet TAKE 1 TABLET BY MOUTH EVERY DAY 30 tablet 11 Past Week at Unknown time  . metoprolol succinate (TOPROL-XL) 25 MG 24 hr tablet TAKE 1 TABLET BY MOUTH EVERY DAY 30 tablet 9 Past Week at Unknown time  . Multiple Vitamin (MULTIVITAMIN WITH MINERALS) TABS Take 1 tablet by mouth daily.    Past Week at Unknown time  . saw palmetto 160 MG capsule Take 160 mg by mouth daily.   Past Week at Unknown time  . Selenium 100 MCG TABS Take 1 tablet by mouth daily.   Past Week at Unknown time  . simvastatin (ZOCOR) 20 MG tablet TAKE 1 TABLET BY MOUTH EVERY NIGHT AT BEDTIME 30 tablet 9 Past Week at Unknown time  . aspirin 325 MG tablet Take 1 tablet by mouth daily.   Not Taking at Unknown time  . Triamcinolone Acetonide (NASACORT ALLERGY 24HR NA) Place into the nose as needed.   Not Taking at Unknown time     No Known Allergies   Past Medical History  Diagnosis Date  . Hypertension   . Hyperlipidemia   . History of nuclear stress test 11/02/2011    bruce myoview; no ischemia  . Coronary artery disease   . BPH (benign prostatic hyperplasia)   . Obesity   . Hypercholesteremia   . Diabetes mellitus without complication     controlled by diet, no medication    Review of systems:      Physical Exam    Heart and lungs: Regular rate and rhythm without rubs or gallops. She bilaterally clear    HEENT: Normocephalic atraumatic eyes are anicteric    Other:  Pertinant exam for procedure: Soft nontender nondistended bowel sounds positive normoactive, slightly protuberant.    Planned proceedures: Colonoscopy and indicated procedures I have discussed the risks benefits and complications of procedures to include not limited to bleeding, infection, perforation and the risk of sedation and the patient wishes to proceed.    Darrell Deem, MD Gastroenterology 06/16/2015  2:36 PM

## 2015-06-16 NOTE — Transfer of Care (Signed)
Immediate Anesthesia Transfer of Care Note  Patient: Darrell Jennings  Procedure(s) Performed: Procedure(s): COLONOSCOPY WITH PROPOFOL (N/A)  Patient Location: PACU and Endoscopy Unit  Anesthesia Type:General  Level of Consciousness: awake, alert  and patient cooperative  Airway & Oxygen Therapy: Patient Spontanous Breathing and Patient connected to nasal cannula oxygen  Post-op Assessment: Report given to RN and Post -op Vital signs reviewed and stable  Post vital signs: Reviewed and stable  Last Vitals:  Filed Vitals:   06/16/15 1401  BP: 147/75  Pulse: 54  Temp:   Resp: 18    Complications: No apparent anesthesia complications

## 2015-06-16 NOTE — Anesthesia Preprocedure Evaluation (Signed)
Anesthesia Evaluation  Patient identified by MRN, date of birth, ID band Patient awake    Reviewed: Allergy & Precautions, NPO status   Airway Mallampati: III       Dental  (+) Teeth Intact   Pulmonary neg pulmonary ROS, former smoker,          Cardiovascular hypertension, + CAD and + Cardiac Stents Normal cardiovascular exam    Neuro/Psych negative neurological ROS  negative psych ROS   GI/Hepatic negative GI ROS, Neg liver ROS,   Endo/Other  diabetes, Well Controlled, Type 2  Renal/GU negative Renal ROS  negative genitourinary   Musculoskeletal negative musculoskeletal ROS (+)   Abdominal Normal abdominal exam  (+)   Peds negative pediatric ROS (+)  Hematology negative hematology ROS (+)   Anesthesia Other Findings   Reproductive/Obstetrics negative OB ROS                             Anesthesia Physical Anesthesia Plan  ASA: III  Anesthesia Plan: General   Post-op Pain Management:    Induction: Intravenous  Airway Management Planned: Nasal Cannula  Additional Equipment:   Intra-op Plan:   Post-operative Plan:   Informed Consent: I have reviewed the patients History and Physical, chart, labs and discussed the procedure including the risks, benefits and alternatives for the proposed anesthesia with the patient or authorized representative who has indicated his/her understanding and acceptance.     Plan Discussed with: CRNA  Anesthesia Plan Comments:         Anesthesia Quick Evaluation

## 2015-06-16 NOTE — Op Note (Signed)
[facility] Gastroenterology Patient Name: Darrell Jennings Procedure Date: 06/16/2015 2:46 PM MRN: [id number] Account #: [id number] Date of Birth: 12-01-1946 Admit Type: Outpatient Age: 69 Room: [facility] ENDO ROOM 2 Gender: Male Note Status: Finalized Procedure:         Colonoscopy Indications:       Screening for colorectal malignant neoplasm Providers:         Christena Deem, MD Referring MD:      Marisue Ivan (Referring MD) Medicines:         Monitored Anesthesia Care Complications:     No immediate complications. Procedure:         Pre-Anesthesia Assessment:                    - ASA Grade Assessment: III - A patient with severe                     systemic disease.                    After obtaining informed consent, the colonoscope was                     passed under direct vision. Throughout the procedure, the                     patient's blood pressure, pulse, and oxygen saturations                     were monitored continuously. The Olympus PCF-160AL                     colonoscope (S#. [id number]) was introduced through the anus                     and advanced to the the cecum, identified by appendiceal                     orifice and ileocecal valve. The colonoscopy was performed                     without difficulty. The patient tolerated the procedure                     well. The quality of the bowel preparation was fair. Findings:      Many small and large-mouthed diverticula were found in the sigmoid       colon, in the descending colon and in the transverse colon.      The retroflexed view of the distal rectum and anal verge was normal and       showed no anal or rectal abnormalities.      The digital rectal exam was normal.      The exam was otherwise without abnormality. Impression:        - Diverticulosis in the sigmoid colon, in the descending                     colon and in the transverse colon.                    - The distal rectum  and anal verge are normal on                     retroflexion view.                    -  The examination was otherwise normal.                    - No specimens collected. Recommendation:    - Discharge patient to home.                    - Repeat colonoscopy in 10 years for screening purposes. Procedure Code(s): --- Professional ---                    [phone], Colonoscopy, flexible; diagnostic, including                     collection of specimen(s) by brushing or washing, when                     performed (separate procedure) Diagnosis Code(s): --- Professional ---                    V76.51, Special screening for malignant neoplasms of colon                    562.10, Diverticulosis of colon (without mention of                     hemorrhage) CPT copyright 2014 American Medical Association. All rights reserved. The codes documented in this report are preliminary and upon coder review may  be revised to meet current compliance requirements. Christena Deem, MD 06/16/2015 3:14:06 PM This report has been signed electronically. Number of Addenda: 0 Note Initiated On: 06/16/2015 2:46 PM Scope Withdrawal Time: 0 hours 6 minutes 55 seconds  Total Procedure Duration: 0 hours 14 minutes 37 seconds       [facility]

## 2015-06-16 NOTE — Anesthesia Postprocedure Evaluation (Signed)
  Anesthesia Post-op Note  Patient: Darrell Jennings  Procedure(s) Performed: Procedure(s): COLONOSCOPY WITH PROPOFOL (N/A)  Anesthesia type:General  Patient location: PACU  Post pain: Pain level controlled  Post assessment: Post-op Vital signs reviewed, Patient's Cardiovascular Status Stable, Respiratory Function Stable, Patent Airway and No signs of Nausea or vomiting  Post vital signs: Reviewed and stable  Last Vitals:  Filed Vitals:   06/16/15 1540  BP: 143/75  Pulse: 54  Temp:   Resp: 15    Level of consciousness: awake, alert  and patient cooperative  Complications: No apparent anesthesia complications

## 2015-06-23 ENCOUNTER — Encounter: Payer: Self-pay | Admitting: Gastroenterology

## 2015-08-28 ENCOUNTER — Other Ambulatory Visit: Payer: Self-pay | Admitting: Cardiovascular Disease

## 2015-08-28 NOTE — Telephone Encounter (Signed)
Rx request sent to pharmacy.  

## 2015-10-28 ENCOUNTER — Other Ambulatory Visit: Payer: Self-pay | Admitting: Cardiovascular Disease

## 2015-10-29 ENCOUNTER — Ambulatory Visit (INDEPENDENT_AMBULATORY_CARE_PROVIDER_SITE_OTHER): Payer: Medicare Other | Admitting: Cardiovascular Disease

## 2015-10-29 ENCOUNTER — Encounter: Payer: Self-pay | Admitting: Cardiovascular Disease

## 2015-10-29 VITALS — BP 150/82 | HR 55 | Ht 70.0 in | Wt 272.0 lb

## 2015-10-29 DIAGNOSIS — I2510 Atherosclerotic heart disease of native coronary artery without angina pectoris: Secondary | ICD-10-CM

## 2015-10-29 DIAGNOSIS — E785 Hyperlipidemia, unspecified: Secondary | ICD-10-CM

## 2015-10-29 DIAGNOSIS — I10 Essential (primary) hypertension: Secondary | ICD-10-CM

## 2015-10-29 DIAGNOSIS — I2583 Coronary atherosclerosis due to lipid rich plaque: Secondary | ICD-10-CM

## 2015-10-29 MED ORDER — METOPROLOL SUCCINATE ER 25 MG PO TB24: 25.0000 mg | ORAL_TABLET | Freq: Every day | ORAL | Status: DC

## 2015-10-29 MED ORDER — CLOPIDOGREL BISULFATE 75 MG PO TABS: 75.0000 mg | ORAL_TABLET | Freq: Every day | ORAL | Status: DC

## 2015-10-29 MED ORDER — LISINOPRIL 5 MG PO TABS: 5.0000 mg | ORAL_TABLET | Freq: Every day | ORAL | Status: DC

## 2015-10-29 MED ORDER — SIMVASTATIN 20 MG PO TABS: 20.0000 mg | ORAL_TABLET | Freq: Every day | ORAL | Status: DC

## 2015-10-29 NOTE — Progress Notes (Signed)
10/29/2015 Darrell Jennings   10/09/1946  [id number]  Primary Physician Marisue IvanLINTHAVONG, KANHKA, MD Primary Cardiologist: Runell GessJonathan J. Zelda Reames MD Roseanne RenoFACP,FACC,FAHA, FSCAI   HPI:  The patient is a 69 year old moderately overweight divorced Caucasian male, father of 1, grandfather to 3 grandchildren, who I last saw in the office 12 months ago. He was initially referred to me by Dr. Mariel KanskyKen Fath [facility] for treatment of an LAD CTO, which I successfully opened with a Resolute drug-eluting stent. Since that time, he has seen me back in followup. His other problems include hypertension and hyperlipidemia. His symptoms resolved after this intervention, and his Myoview performed November 02, 2011, showed no ischemia.he had a Myoview performed prior to extensive sinus surgery for preoperative clearance 11/07/13 which was normal. He does complain of occasional substernal chest pain with left upper extremity radiation. His most recent lipid profile performed 10/24/15 revealed total cholesterol of 114, LDL 62 and HDL of 33.  Current Outpatient Prescriptions  Medication Sig Dispense Refill  . arginine 500 MG tablet Take 4 tablets by mouth daily.    Marland Kitchen. aspirin 81 MG tablet Take 81 mg by mouth daily.    . B Complex Vitamins (B COMPLEX 100 PO) Take 1 capsule by mouth.    . clopidogrel (PLAVIX) 75 MG tablet TAKE 1 TABLET BY MOUTH EVERY DAY. 30 tablet 11  . Coenzyme Q10 (CO Q 10) 100 MG CAPS Take 1 capsule by mouth daily.    Marland Kitchen. DHEA 25 MG CAPS Take 1 tablet by mouth daily.    . fish oil-omega-3 fatty acids 1000 MG capsule Take 1 g by mouth daily.    Marland Kitchen. GARLIC PO Take by mouth daily.    Marland Kitchen. lisinopril (PRINIVIL,ZESTRIL) 5 MG tablet TAKE 1 TABLET BY MOUTH EVERY DAY 30 tablet 11  . metoprolol succinate (TOPROL-XL) 25 MG 24 hr tablet TAKE 1 TABLET BY MOUTH EVERY DAY 30 tablet 2  . Multiple Vitamin (MULTIVITAMIN WITH MINERALS) TABS Take 1 tablet by mouth daily.    . saw palmetto 160 MG capsule Take 160 mg by mouth daily.     . Selenium 100 MCG TABS Take 1 tablet by mouth daily.    . simvastatin (ZOCOR) 20 MG tablet TAKE 1 TABLET BY MOUTH EVERY NIGHT AT BEDTIME 30 tablet 2   No current facility-administered medications for this visit.    No Known Allergies  Social History   Social History  . Marital Status: Single    Spouse Name: N/A  . Number of Children: 1  . Years of Education: N/A   Occupational History  . Not on file.   Social History Main Topics  . Smoking status: Former Smoker    Quit date: 12/27/1973  . Smokeless tobacco: Never Used  . Alcohol Use: No     Comment: beer  every once in while  . Drug Use: No  . Sexual Activity: Not on file   Other Topics Concern  . Not on file   Social History Narrative     Review of Systems: General: negative for chills, fever, night sweats or weight changes.  Cardiovascular: negative for chest pain, dyspnea on exertion, edema, orthopnea, palpitations, paroxysmal nocturnal dyspnea or shortness of breath Dermatological: negative for rash Respiratory: negative for cough or wheezing Urologic: negative for hematuria Abdominal: negative for nausea, vomiting, diarrhea, bright red blood per rectum, melena, or hematemesis Neurologic: negative for visual changes, syncope, or dizziness All other systems reviewed and are otherwise negative except as noted  above.    Blood pressure 150/82, pulse 55, height  (1.778 m), weight 272 lb (123.378 kg).  General appearance: alert and no distress Neck: no adenopathy, no carotid bruit, no JVD, supple, symmetrical, trachea midline and thyroid not enlarged, symmetric, no tenderness/mass/nodules Lungs: clear to auscultation bilaterally Heart: regular rate and rhythm, S1, S2 normal, no murmur, click, rub or gallop Extremities: extremities normal, atraumatic, no cyanosis or edema  EKG sinus bradycardia 55 with septal Q waves. I personally reviewed this EKG  ASSESSMENT AND PLAN:   Hyperlipidemia History of  hyperlipidemia on statin 20 mg a day with recent lipid profile performed 10/16/15 revealing total cholesterol of 114, LDL 62 HDL of 33.  Essential hypertension History of hypertension blood pressure measured at 150/82. He is on lisinopril and metoprolol. Continue current meds at current dosing  Coronary artery disease History of coronary artery disease status post LAD CTO PCI and stenting by myself using a resolute drug-eluting stent 06/14/11. Follow Myoview performed 12/02/11 was nonischemic as was the one done 11/07/13 for preoperative clearance before sinus surgery. He does complain of some occasional chest pain and left upper extremity radiation which she attributes to lifting heavy objects.      Runell Gess MD FACP,FACC,FAHA, [facility] 10/29/2015 10:57 AM

## 2015-10-29 NOTE — Assessment & Plan Note (Signed)
History of coronary artery disease status post LAD CTO PCI and stenting by myself using a resolute drug-eluting stent 06/14/11. Follow Myoview performed 12/02/11 was nonischemic as was the one done 11/07/13 for preoperative clearance before sinus surgery. He does complain of some occasional chest pain and left upper extremity radiation which she attributes to lifting heavy objects.

## 2015-10-29 NOTE — Patient Instructions (Signed)

## 2015-10-29 NOTE — Assessment & Plan Note (Signed)
History of hypertension blood pressure measured at 150/82. He is on lisinopril and metoprolol. Continue current meds at current dosing

## 2015-10-29 NOTE — Assessment & Plan Note (Signed)
History of hyperlipidemia on statin 20 mg a day with recent lipid profile performed 10/16/15 revealing total cholesterol of 114, LDL 62 HDL of 33.

## 2016-03-31 ENCOUNTER — Other Ambulatory Visit: Payer: Self-pay | Admitting: Orthopedic Surgery

## 2016-03-31 DIAGNOSIS — M1612 Unilateral primary osteoarthritis, left hip: Secondary | ICD-10-CM

## 2016-04-21 ENCOUNTER — Ambulatory Visit
Admission: RE | Admit: 2016-04-21 | Discharge: 2016-04-21 | Disposition: A | Discharge status: Home / Self Care | Payer: Medicare Other | Source: Ambulatory Visit | Attending: Orthopedic Surgery | Admitting: Orthopedic Surgery

## 2016-04-21 DIAGNOSIS — M1612 Unilateral primary osteoarthritis, left hip: Secondary | ICD-10-CM

## 2016-04-21 DIAGNOSIS — M160 Bilateral primary osteoarthritis of hip: Secondary | ICD-10-CM | POA: Diagnosis not present

## 2016-04-21 DIAGNOSIS — M4186 Other forms of scoliosis, lumbar region: Secondary | ICD-10-CM | POA: Diagnosis not present

## 2016-04-21 DIAGNOSIS — M47816 Spondylosis without myelopathy or radiculopathy, lumbar region: Secondary | ICD-10-CM | POA: Insufficient documentation

## 2016-09-28 ENCOUNTER — Ambulatory Visit (INDEPENDENT_AMBULATORY_CARE_PROVIDER_SITE_OTHER): Payer: Medicare Other | Admitting: Cardiovascular Disease

## 2016-09-28 ENCOUNTER — Encounter: Payer: Self-pay | Admitting: Cardiovascular Disease

## 2016-09-28 VITALS — BP 136/76 | HR 57 | Ht 69.0 in | Wt 263.8 lb

## 2016-09-28 DIAGNOSIS — I2510 Atherosclerotic heart disease of native coronary artery without angina pectoris: Secondary | ICD-10-CM | POA: Diagnosis not present

## 2016-09-28 DIAGNOSIS — I10 Essential (primary) hypertension: Secondary | ICD-10-CM | POA: Diagnosis not present

## 2016-09-28 DIAGNOSIS — Z01818 Encounter for other preprocedural examination: Secondary | ICD-10-CM | POA: Diagnosis not present

## 2016-09-28 DIAGNOSIS — I2583 Coronary atherosclerosis due to lipid rich plaque: Secondary | ICD-10-CM

## 2016-09-28 NOTE — Assessment & Plan Note (Signed)
History of hyperlipidemia on statin therapy followed by his PCP 

## 2016-09-28 NOTE — Patient Instructions (Signed)
Medication Instructions:  NO CHANGES.  Labwork: Labwork will be requested from your primary care physician.   Testing/Procedures: Your physician has requested that you have en exercise stress myoview. For further information please visit [email or web address]. Please follow instruction sheet, as given.    Follow-Up: Your physician wants you to follow-up in: 12 MONTHS WITH DR Allyson SabalBERRY.  You will receive a reminder letter in the mail two months in advance. If you don't receive a letter, please call our office to schedule the follow-up appointment.   Any Other Special Instructions Will Be Listed Below (If Applicable).  Exercise Stress Electrocardiogram An exercise stress electrocardiogram is a test that is done to evaluate the blood supply to your heart. This test may also be called exercise stress electrocardiography. The test is done while you are walking on a treadmill. The goal of this test is to raise your heart rate. This test is done to find areas of poor blood flow to the heart by determining the extent of coronary artery disease (CAD).   CAD is defined as narrowing in one or more heart (coronary) arteries of more than 70%. If you have an abnormal test result, this may mean that you are not getting adequate blood flow to your heart during exercise. Additional testing may be needed to understand why your test was abnormal. LET [facility] PROVID[facility] KNOW ABOUT:   Any allergies you have.  All medicines you are taking, including vitamins, herbs, eye drops, creams, and over-the-counter medicines.  Previous problems you or members of your family have had with the use of anesthetics.  Any blood disorders you have.  Previous surgeries you have had.  Medical conditions you have.  Possibility of pregnancy, if this applies. RISKS AND COMPLICATIONS Generally, this is a safe procedure. However, as with any procedure, complications can occur. Possible complications can include:  Pain or  pressure in the following areas:  Chest.  Jaw or neck.  Between your shoulder blades.  Radiating down your left arm.  Dizziness or light-headedness.  Shortness of breath.  Increased or irregular heartbeats.  Nausea or vomiting.  Heart attack (rare). BEFORE THE PROCEDURE  Avoid all forms of caffeine 24 hours before your test or as directed by your health care provider. This includes coffee, tea (even decaffeinated tea), caffeinated sodas, chocolate, cocoa, and certain pain medicines.  Follow your health care provider's instructions regarding eating and drinking before the test.  Take your medicines as directed at regular times with water unless instructed otherwise. Exceptions may include:  If you have diabetes, ask how you are to take your insulin or pills. It is common to adjust insulin dosing the morning of the test.  If you are taking beta-blocker medicines, it is important to talk to your health care provider about these medicines well before the date of your test. Taking beta-blocker medicines may interfere with the test. In some cases, these medicines need to be changed or stopped 24 hours or more before the test.  If you wear a nitroglycerin patch, it may need to be removed prior to the test. Ask your health care provider if the patch should be removed before the test.  If you use an inhaler for any breathing condition, bring it with you to the test.  If you are an outpatient, bring a snack so you can eat right after the stress phase of the test.  Do not smoke for 4 hours prior to the test or as directed by your health care provider.  Do not apply lotions, powders, creams, or oils on your chest prior to the test.  Wear loose-fitting clothes and comfortable shoes for the test. This test involves walking on a treadmill. PROCEDURE  Multiple patches (electrodes) will be put on your chest. If needed, small areas of your chest may have to be shaved to get better contact  with the electrodes. Once the electrodes are attached to your body, multiple wires will be attached to the electrodes and your heart rate will be monitored.  Your heart will be monitored both at rest and while exercising.  You will walk on a treadmill. The treadmill will be started at a slow pace. The treadmill speed and incline will gradually be increased to raise your heart rate. AFTER THE PROCEDURE  Your heart rate and blood pressure will be monitored after the test.  You may return to your normal schedule including diet, activities, and medicines, unless your health care provider tells you otherwise.   This information is not intended to replace advice given to you by your health care provider. Make sure you discuss any questions you have with your health care provider.   Document Released: 12/10/2000 Document Revised: 12/18/2013 Document Reviewed: 08/20/2013 Elsevier Interactive Patient Education [employer]    If you need a refill on your cardiac medications before your next appointment, please call your pharmacy.

## 2016-09-28 NOTE — Assessment & Plan Note (Signed)
History of CAD status post LAD CTO PCI and stenting using a resolute drug-eluting stent by myself 06/14/11. His last Myoview performed in 2014 was nonischemic done for preoperative clearance before sinus surgery. He currently denies chest pain or shortness of breath. He needs an elective total hip replacement. Repeat exercise Myoview to risk stratify him.

## 2016-09-28 NOTE — Progress Notes (Signed)
09/28/2016 Darrell Jennings   02/01/1946  [id number]  Primary Physician Marisue IvanLINTHAVONG, KANHKA, MD Primary Cardiologist: Runell GessJonathan J Delanee Xin MD Roseanne RenoFACP, FACC, FAHA, FSCAI  HPI:  The patient is a 70 year old moderately overweight divorced Caucasian male, father of 1, grandfather to 3 grandchildren, who I last saw in the office 10/29/15. He was initially referred to me by Dr. Mariel KanskyKen Fath at [facility] for treatment of an LAD CTO, which I successfully opened with a Resolute drug-eluting stent. Since that time, he has seen me back in followup. His other problems include hypertension and hyperlipidemia. His symptoms resolved after this intervention, and his Myoview performed November 02, 2011, showed no ischemia.he had a Myoview performed prior to extensive sinus surgery for preoperative clearance 11/07/13 which was normal. He does complain of occasional substernal chest pain with left upper extremity radiation. His most recent lipid profile performed 10/24/15 revealed total cholesterol of 114, LDL 62 and HDL of 33. Since I saw him a year ago he's been asymptomatic. He is scheduled to have elective total hip replacement at [facility]. Since it's been 3 years since his last functional study am going to repeat an exercise Myoview stress test to risk stratify him.   Current Outpatient Prescriptions  Medication Sig Dispense Refill  . arginine 500 MG tablet Take 4 tablets by mouth daily.    Marland Kitchen. aspirin 81 MG tablet Take 81 mg by mouth daily.    . B Complex Vitamins (B COMPLEX 100 PO) Take 1 capsule by mouth.    . clopidogrel (PLAVIX) 75 MG tablet Take 1 tablet (75 mg total) by mouth daily. 90 tablet 3  . Coenzyme Q10 (CO Q 10) 100 MG CAPS Take 1 capsule by mouth daily.    Marland Kitchen. DHEA 25 MG CAPS Take 1 tablet by mouth daily.    . fish oil-omega-3 fatty acids 1000 MG capsule Take 1 g by mouth daily.    Marland Kitchen. GARLIC PO Take by mouth daily.    Marland Kitchen. lisinopril (PRINIVIL,ZESTRIL) 5 MG tablet Take 1 tablet (5 mg  total) by mouth daily. 90 tablet 3  . metoprolol succinate (TOPROL-XL) 25 MG 24 hr tablet Take 1 tablet (25 mg total) by mouth daily. 90 tablet 3  . Multiple Vitamin (MULTIVITAMIN WITH MINERALS) TABS Take 1 tablet by mouth daily.    . saw palmetto 160 MG capsule Take 160 mg by mouth daily.    . Selenium 100 MCG TABS Take 1 tablet by mouth daily.    . simvastatin (ZOCOR) 20 MG tablet Take 1 tablet (20 mg total) by mouth at bedtime. 90 tablet 3  . traMADol (ULTRAM) 50 MG tablet Take 1 tablet by mouth as directed.     No current facility-administered medications for this visit.     No Known Allergies  Social History   Social History  . Marital status: Single    Spouse name: N/A  . Number of children: 1  . Years of education: N/A   Occupational History  . Not on file.   Social History Main Topics  . Smoking status: Former Smoker    Quit date: 12/27/1973  . Smokeless tobacco: Never Used  . Alcohol use No     Comment: beer  every once in while  . Drug use: No  . Sexual activity: Not on file   Other Topics Concern  . Not on file   Social History Narrative  . No narrative on file     Review of Systems:  General: negative for chills, fever, night sweats or weight changes.  Cardiovascular: negative for chest pain, dyspnea on exertion, edema, orthopnea, palpitations, paroxysmal nocturnal dyspnea or shortness of breath Dermatological: negative for rash Respiratory: negative for cough or wheezing Urologic: negative for hematuria Abdominal: negative for nausea, vomiting, diarrhea, bright red blood per rectum, melena, or hematemesis Neurologic: negative for visual changes, syncope, or dizziness All other systems reviewed and are otherwise negative except as noted above.    Blood pressure 136/76, pulse (!) 57, height 5\' 9"  (1.753 m), weight 263 lb 12.8 oz (119.7 kg).  General appearance: alert and no distress Neck: no adenopathy, no carotid bruit, no JVD, supple, symmetrical,  trachea midline and thyroid not enlarged, symmetric, no tenderness/mass/nodules Lungs: clear to auscultation bilaterally Heart: regular rate and rhythm, S1, S2 normal, no murmur, click, rub or gallop Extremities: extremities normal, atraumatic, no cyanosis or edema  EKG sinus bradycardia at 57 with septal Q waves. I personally reviewed this EKG  ASSESSMENT AND PLAN:   Coronary artery disease History of CAD status post LAD CTO PCI and stenting using a resolute drug-eluting stent by myself 06/14/11. His last Myoview performed in 2014 was nonischemic done for preoperative clearance before sinus surgery. He currently denies chest pain or shortness of breath. He needs an elective total hip replacement. Repeat exercise Myoview to risk stratify him.  Essential hypertension History of hypertension blood pressure measured 136/76. He is a little lisinopril and metoprolol. Continue current meds at current dosing  Hyperlipidemia History of hyperlipidemia on statin therapy followed by his PCP      Runell Gess MD [facility], [facility] 09/28/2016 9:24 AM

## 2016-09-28 NOTE — Assessment & Plan Note (Signed)
History of hypertension blood pressure measured 136/76. He is a little lisinopril and metoprolol. Continue current meds at current dosing

## 2016-10-08 ENCOUNTER — Telehealth (HOSPITAL_COMMUNITY): Payer: Self-pay

## 2016-10-08 NOTE — Telephone Encounter (Signed)
Encounter complete. 

## 2016-10-13 ENCOUNTER — Ambulatory Visit (HOSPITAL_COMMUNITY)
Admission: RE | Admit: 2016-10-13 | Discharge: 2016-10-13 | Disposition: A | Discharge status: Home / Self Care | Payer: Medicare Other | Source: Ambulatory Visit | Attending: Cardiology | Admitting: Cardiology

## 2016-10-13 DIAGNOSIS — E669 Obesity, unspecified: Secondary | ICD-10-CM | POA: Insufficient documentation

## 2016-10-13 DIAGNOSIS — I10 Essential (primary) hypertension: Secondary | ICD-10-CM | POA: Diagnosis not present

## 2016-10-13 DIAGNOSIS — I2510 Atherosclerotic heart disease of native coronary artery without angina pectoris: Secondary | ICD-10-CM

## 2016-10-13 DIAGNOSIS — Z01818 Encounter for other preprocedural examination: Secondary | ICD-10-CM | POA: Insufficient documentation

## 2016-10-13 DIAGNOSIS — I2583 Coronary atherosclerosis due to lipid rich plaque: Secondary | ICD-10-CM | POA: Insufficient documentation

## 2016-10-13 DIAGNOSIS — Z6838 Body mass index (BMI) 38.0-38.9, adult: Secondary | ICD-10-CM | POA: Insufficient documentation

## 2016-10-13 DIAGNOSIS — Z87891 Personal history of nicotine dependence: Secondary | ICD-10-CM | POA: Insufficient documentation

## 2016-10-13 LAB — MYOCARDIAL PERFUSION IMAGING
CHL CUP NUCLEAR SDS: 1
CHL CUP RESTING HR STRESS: 50 {beats}/min
CSEPPHR: 65 {beats}/min
LV dias vol: 127 mL (ref 62–150)
LV sys vol: 59 mL
SRS: 2
SSS: 3
TID: 1.05

## 2016-10-13 MED ORDER — REGADENOSON 0.4 MG/5ML IV SOLN
0.4000 mg | Freq: Once | INTRAVENOUS | Status: AC
  Administered 2016-10-13: 0.4 mg via INTRAVENOUS

## 2016-10-13 MED ORDER — TECHNETIUM TC 99M TETROFOSMIN IV KIT
32.3000 | PACK | Freq: Once | INTRAVENOUS | Status: AC | PRN
  Administered 2016-10-13: 32.3 via INTRAVENOUS
  Filled 2016-10-13: qty 33

## 2016-10-13 MED ORDER — TECHNETIUM TC 99M TETROFOSMIN IV KIT
11.0000 | PACK | Freq: Once | INTRAVENOUS | Status: AC | PRN
  Administered 2016-10-13: 11 via INTRAVENOUS
  Filled 2016-10-13: qty 11

## 2016-10-14 ENCOUNTER — Telehealth: Payer: Self-pay | Admitting: Cardiovascular Disease

## 2016-10-14 NOTE — Telephone Encounter (Signed)
Pt returned call, pls call back after 200pm, [phone]

## 2016-10-14 NOTE — Telephone Encounter (Signed)
F/u Message ° °Pt returning RN call. Please call back to discuss  °

## 2016-10-14 NOTE — Telephone Encounter (Signed)
Notes Recorded by Runell GessJonathan J Berry, MD on 10/13/2016 at 4:56 PM EDT Essentially normal study. Repeat when clinically indicated.   Results called to pt. Pt verbalized understanding.

## 2016-10-27 ENCOUNTER — Encounter
Admission: RE | Admit: 2016-10-27 | Discharge: 2016-10-27 | Disposition: A | Discharge status: Home / Self Care | Payer: Medicare Other | Source: Ambulatory Visit | Attending: Orthopedic Surgery | Admitting: Orthopedic Surgery

## 2016-10-27 DIAGNOSIS — Z01818 Encounter for other preprocedural examination: Secondary | ICD-10-CM | POA: Diagnosis not present

## 2016-10-27 HISTORY — DX: Angina pectoris, unspecified: I20.9

## 2016-10-27 HISTORY — DX: Unspecified osteoarthritis, unspecified site: M19.90

## 2016-10-27 LAB — URINALYSIS COMPLETE WITH MICROSCOPIC (ARMC ONLY)
BACTERIA UA: NONE SEEN
Bilirubin Urine: NEGATIVE
GLUCOSE, UA: NEGATIVE mg/dL
Ketones, ur: NEGATIVE mg/dL
LEUKOCYTES UA: NEGATIVE
Nitrite: NEGATIVE
PH: 6 (ref 5.0–8.0)
Protein, ur: NEGATIVE mg/dL
Specific Gravity, Urine: 1.011 (ref 1.005–1.030)

## 2016-10-27 LAB — TYPE AND SCREEN
ABO/RH(D): A POS
Antibody Screen: NEGATIVE

## 2016-10-27 LAB — APTT: aPTT: 30 seconds (ref 24–36)

## 2016-10-27 LAB — SEDIMENTATION RATE: SED RATE: 8 mm/h (ref 0–20)

## 2016-10-27 LAB — SURGICAL PCR SCREEN
MRSA, PCR: NEGATIVE
STAPHYLOCOCCUS AUREUS: NEGATIVE

## 2016-10-27 LAB — PROTIME-INR
INR: 0.92
Prothrombin Time: 12.4 seconds (ref 11.4–15.2)

## 2016-10-27 NOTE — Pre-Procedure Instructions (Signed)
" (1.753 m) 265 lb (120.2 kg) 39.2  Study Highlights     The left ventricular ejection fraction is mildly decreased (45-54%).  Nuclear stress EF: 54%.  There was no ST segment deviation noted during stress.  The study is normal.  This is a low risk study.   Normal resting and stress perfusion. No ischemia or infarction EF 54%   Stress Findings   ECG Baseline ECG exhibits normal sinus rhythm..    Stress Findings A pharmacological stress test was performed using IV Lexiscan 0.4mg  over 10 seconds performed without concurrent submaximal exercise.  The patient reported no symptoms during the stress test. The patient experienced no angina during the stress test.   Test was stopped per protocol.    Response to Stress There was no ST segment deviation noted during stress.  Arrhythmias during stress: none.  Arrhythmias during recovery: none.  There were no significant arrhythmias noted during the test.  ECG was uninterpretable.    Nuclear History and Indications   History and Indications Indication for Stress Test: Evaluation of extent and severity of coronary artery disease History: CAD;STENT; Last NU CMPI on 11/07/2013-normal; EF=64% Cardiac Risk Factors: History of Smoking, Hypertension, Lipids and Obesity  Symptoms: Dizziness    Stress Measurements   Baseline Vitals  Rest HR 50 bpm    Rest BP 104/66 mmHg    Peak Stress Vitals  Peak HR 65 bpm    Peak BP 123/61 mmHg       Nuclear Stress Measurements   LV sys vol 59 mL    TID 1.05     LV dias vol 127 mL    SSS 3     SRS 2     SDS 1          Nuclear Stress Findings   Isotope administration Rest isotope was administered with an IV injection of 11.0 mCi technetium tetrofosmin. Rest SPECT images were obtained approximately 45 minutes post tracer injection. Stress isotope was administered with an IV injection of 32.3 mCi technetium tetrofosmin 20 seconds post IV Lexiscan administration. Stress SPECT images  were obtained approximately 60 minutes post tracer injection.    Nuclear Study Quality Overall image quality is good.    Nuclear Measurements Study was gated.    Perfusion Summary Normal resting and stress perfusion. No ischemia or infarction EF 54%    Overall Study Impression Myocardial perfusion is normal. The study is normal. This is a low risk study. Overall left ventricular systolic function was normal. LV cavity size is normal. Nuclear stress EF: 54%. The left ventricular ejection fraction is mildly decreased (45-54%). There is no prior study for comparison.  From: ACCF/SCAI/STS/AATS/AHA/ASNC/HFSA/SCCT 2012 Appropriate Use Criteria for Coronary Revascularization Focused Update    Wall Scoring   Score Index: 1.000 Percent Normal: 100.0%           The left ventricular wall motion is normal.          Signed   Electronically signed by Wendall StadePeter C Nishan, MD on 10/13/16 at 1601 EDT  Report approved and finalized on 10/13/2016 1559  Imaging   Imaging Information  Order-Level Documents - 10/13/2016:   Scan on 10/13/2016 3:13 PM by Imelda PillowGina E Compton : MPI Images  Scan on 10/13/2016 3:14 PM by Imelda PillowGina E Compton : Protocol and Data  Scan on 10/13/2016 3:14 PM by Raliegh IpGina E Compton : Consent  Scan on 10/13/2016 1:50 PM by Provider Default, MD      Encounter-Level Documents - 10/13/2016:  Scan on 10/19/2016 1:00 PM by Provider Default, MD  Electronic signature on 10/13/2016 11:49 AM      Exam Information   Status Exam Begun  Exam Ended   Final [99] 10/13/2016 12:18 PM 10/13/2016 2:39 PM  External Result Report   External Result Report  Order  Myocardial Perfusion Imaging [CAR2012] (Order [id number])  Procedure Abnormality Status  Myocardial Perfusion Imaging    Order Providers   Authorizing Encounter Billing  Runell GessJonathan J Berry [facility] NUC MED Wendall StadePeter C Nishan      Authorizing Provider Audit Trail   Date/Time Authorizing Provider Changed by  10/13/2016 11:50 AM Runell GessJonathan J  Berry, MD Stann MainlandJennifer O Clark, RN  Original Order   Ordered On Ordered By   09/28/2016 9:26 AM Stann MainlandJennifer O Clark, RN           Associated Diagnoses    ICD-9-CM ICD-10-CM  Coronary artery disease due to lipid rich plaque    414.00 414.3 I25.10 I25.83  Essential hypertension    401.9 I10  Preoperative testing    V72.84 Z01.818  Order Questions   Question Answer Comment  Type of stress Exercise   Patient weight in lbs 263       Appointments for this Order

## 2016-10-27 NOTE — Pre-Procedure Instructions (Signed)
Nida BoatmanRobert W Cando  09/28/2016 8:45 AM  Office Visit  MRN:  [id number]  Description: Male DOB: 09/21/1946 Provider: Runell GessJonathan J Berry, MD Department: [facility]  Vitals   BP  136/76   Pulse    57   Ht  5\' 9"  (1.753 m)   Wt  263 lb 12.8 oz (119.7 kg)   BMI  38.96 kg/m    Progress Notes   Runell GessJonathan J Berry, MD at 09/28/2016 8:45 AM   Status: Signed        09/28/2016 Nida Boatmanobert W Shewell   01/13/1946  [id number]  Primary Physician Marisue IvanLINTHAVONG, KANHKA, MD Primary Cardiologist: Runell GessJonathan J Berry MD Roseanne RenoFACP, FACC, FAHA, FSCAI  HPI:  The patient is a 70 year old moderately overweight divorced Caucasian male, father of 1, grandfather to 3 grandchildren, who I last saw in the office 10/29/15. He was initially referred to me by Dr. Mariel KanskyKen Fath at [facility] for treatment of an LAD CTO, which I successfully opened with a Resolute drug-eluting stent. Since that time, he has seen me back in followup. His other problems include hypertension and hyperlipidemia. His symptoms resolved after this intervention, and his Myoview performed November 02, 2011, showed no ischemia.he had a Myoview performed prior to extensive sinus surgery for preoperative clearance 11/07/13 which was normal. He does complain of occasional substernal chest pain with left upper extremity radiation. His most recent lipid profile performed 10/24/15 revealed total cholesterol of 114, LDL 62 and HDL of 33. Since I saw him a year ago he's been asymptomatic. He is scheduled to have elective total hip replacement at [facility]. Since it's been 3 years since his last functional study am going to repeat an exercise Myoview stress test to risk stratify him.         Current Outpatient Prescriptions  Medication Sig Dispense Refill  . arginine 500 MG tablet Take 4 tablets by mouth daily.    Marland Kitchen. aspirin 81 MG tablet Take 81 mg by mouth daily.    . B Complex Vitamins (B COMPLEX 100 PO) Take 1 capsule by mouth.    .  clopidogrel (PLAVIX) 75 MG tablet Take 1 tablet (75 mg total) by mouth daily. 90 tablet 3  . Coenzyme Q10 (CO Q 10) 100 MG CAPS Take 1 capsule by mouth daily.    Marland Kitchen. DHEA 25 MG CAPS Take 1 tablet by mouth daily.    . fish oil-omega-3 fatty acids 1000 MG capsule Take 1 g by mouth daily.    Marland Kitchen. GARLIC PO Take by mouth daily.    Marland Kitchen. lisinopril (PRINIVIL,ZESTRIL) 5 MG tablet Take 1 tablet (5 mg total) by mouth daily. 90 tablet 3  . metoprolol succinate (TOPROL-XL) 25 MG 24 hr tablet Take 1 tablet (25 mg total) by mouth daily. 90 tablet 3  . Multiple Vitamin (MULTIVITAMIN WITH MINERALS) TABS Take 1 tablet by mouth daily.    . saw palmetto 160 MG capsule Take 160 mg by mouth daily.    . Selenium 100 MCG TABS Take 1 tablet by mouth daily.    . simvastatin (ZOCOR) 20 MG tablet Take 1 tablet (20 mg total) by mouth at bedtime. 90 tablet 3  . traMADol (ULTRAM) 50 MG tablet Take 1 tablet by mouth as directed.     No current facility-administered medications for this visit.     No Known Allergies  Social History        Social History  . Marital status: Single    Spouse name: N/A  . Number  of children: 1  . Years of education: N/A      Occupational History  . Not on file.         Social History Main Topics  . Smoking status: Former Smoker    Quit date: 12/27/1973  . Smokeless tobacco: Never Used  . Alcohol use No     Comment: beer  every once in while  . Drug use: No  . Sexual activity: Not on file       Other Topics Concern  . Not on file      Social History Narrative  . No narrative on file     Review of Systems: General: negative for chills, fever, night sweats or weight changes.  Cardiovascular: negative for chest pain, dyspnea on exertion, edema, orthopnea, palpitations, paroxysmal nocturnal dyspnea or shortness of breath Dermatological: negative for rash Respiratory: negative for cough or wheezing Urologic: negative for hematuria Abdominal:  negative for nausea, vomiting, diarrhea, bright red blood per rectum, melena, or hematemesis Neurologic: negative for visual changes, syncope, or dizziness All other systems reviewed and are otherwise negative except as noted above.    Blood pressure 136/76, pulse (!) 57, height 5\' 9"  (1.753 m), weight 263 lb 12.8 oz (119.7 kg).  General appearance: alert and no distress Neck: no adenopathy, no carotid bruit, no JVD, supple, symmetrical, trachea midline and thyroid not enlarged, symmetric, no tenderness/mass/nodules Lungs: clear to auscultation bilaterally Heart: regular rate and rhythm, S1, S2 normal, no murmur, click, rub or gallop Extremities: extremities normal, atraumatic, no cyanosis or edema  EKG sinus bradycardia at 57 with septal Q waves. I personally reviewed this EKG  ASSESSMENT AND PLAN:   Coronary artery disease History of CAD status post LAD CTO PCI and stenting using a resolute drug-eluting stent by myself 06/14/11. His last Myoview performed in 2014 was nonischemic done for preoperative clearance before sinus surgery. He currently denies chest pain or shortness of breath. He needs an elective total hip replacement. Repeat exercise Myoview to risk stratify him.  Essential hypertension History of hypertension blood pressure measured 136/76. He is a little lisinopril and metoprolol. Continue current meds at current dosing  Hyperlipidemia History of hyperlipidemia on statin therapy followed by his PCP      Runell GessJonathan J. Berry MD MaroaFACP,FACC,FAHA, [facility] 09/28/2016 9:24 AM    Coronary artery disease - Runell GessJonathan J Berry, MD at 09/28/2016 9:23 AM   Status: Written  Related Problem: Coronary artery disease    History of CAD status post LAD CTO PCI and stenting using a resolute drug-eluting stent by myself 06/14/11. His last Myoview performed in 2014 was nonischemic done for preoperative clearance before sinus surgery. He currently denies chest pain or shortness of  breath. He needs an elective total hip replacement. Repeat exercise Myoview to risk stratify him.    Essential hypertension - Runell GessJonathan J Berry, MD at 09/28/2016 9:24 AM   Status: Written  Related Problem: Essential hypertension    History of hypertension blood pressure measured 136/76. He is a little lisinopril and metoprolol. Continue current meds at current dosing    Hyperlipidemia - Runell GessJonathan J Berry, MD at 09/28/2016 9:24 AM   Status: Written  Related Problem: Hyperlipidemia    History of hyperlipidemia on statin therapy followed by his PCP

## 2016-10-27 NOTE — Patient Instructions (Signed)
  Your procedure is scheduled on:11/09/16 Tues Report to Same Day Surgery 2nd floor medical mall To find out your arrival time please call [phone] between 1PM - 3PM on 11/08/16 Mon  Remember: Instructions that are not followed completely may result in serious medical risk, up to and including death, or upon the discretion of your surgeon and anesthesiologist your surgery may need to be rescheduled.    _x___ 1. Do not eat food or drink liquids after midnight. No gum chewing or hard candies.     __x__ 2. No Alcohol for 24 hours before or after surgery.   __x__3. No Smoking for 24 prior to surgery.   ____  4. Bring all medications with you on the day of surgery if instructed.    __x__ 5. Notify your doctor if there is any change in your medical condition     (cold, fever, infections).     Do not wear jewelry, make-up, hairpins, clips or nail polish.  Do not wear lotions, powders, or perfumes. You may wear deodorant.  Do not shave 48 hours prior to surgery. Men may shave face and neck.  Do not bring valuables to the hospital.    [facility] is not responsible for any belongings or valuables.               Contacts, dentures or bridgework may not be worn into surgery.  Leave your suitcase in the car. After surgery it may be brought to your room.  For patients admitted to the hospital, discharge time is determined by your treatment team.   Patients discharged the day of surgery will not be allowed to drive home.    Please read over the following fact sheets that you were given:   [facility] Preparing for Surgery and or MRSA Information   _x___ Take these medicines the morning of surgery with A SIP OF WATER:    1. lisinopril (PRINIVIL,ZESTRIL) 5 MG tablet  2.metoprolol succinate (TOPROL-XL) 25 MG 24 hr tablet  3.simvastatin (ZOCOR) 20 MG tablet  4.  5.  6.  ____Fleets enema or Magnesium Citrate as directed.   _x___ Use CHG Soap or sage wipes as directed on instruction  sheet   ____ Use inhalers on the day of surgery and bring to hospital day of surgery  ____ Stop metformin 2 days prior to surgery    ____ Take 1/2 of usual insulin dose the night before surgery and none on the morning of           surgery.   _x___ Stop aspirin or coumadin, or plavix Stop Plavix 5 days before surgery,  Stop Aspirin 1 week before surgery  x__ Stop Anti-inflammatories such as Advil, Aleve, Ibuprofen, Motrin, Naproxen,          Naprosyn, Goodies powders or aspirin products. Ok to take Tylenol.   _x___ Stop supplements 1 week before surgery.    ____ Bring C-Pap to the hospital.

## 2016-10-28 LAB — URINE CULTURE: CULTURE: NO GROWTH

## 2016-11-09 ENCOUNTER — Encounter
Admission: RE | Disposition: A | Discharge status: Skilled Nursing Facility | Payer: Self-pay | Source: Ambulatory Visit | Attending: Orthopedic Surgery

## 2016-11-09 ENCOUNTER — Inpatient Hospital Stay: Payer: Medicare Other | Admitting: Anesthesiology

## 2016-11-09 ENCOUNTER — Inpatient Hospital Stay: Payer: Medicare Other

## 2016-11-09 ENCOUNTER — Encounter: Payer: Self-pay | Admitting: *Deleted

## 2016-11-09 ENCOUNTER — Inpatient Hospital Stay
Admission: RE | Admit: 2016-11-09 | Discharge: 2016-11-12 | DRG: 470 | Disposition: A | Discharge status: Skilled Nursing Facility | Payer: Medicare Other | Source: Ambulatory Visit | Attending: Orthopedic Surgery | Admitting: Orthopedic Surgery

## 2016-11-09 DIAGNOSIS — M1612 Unilateral primary osteoarthritis, left hip: Secondary | ICD-10-CM | POA: Diagnosis present

## 2016-11-09 DIAGNOSIS — E7800 Pure hypercholesterolemia, unspecified: Secondary | ICD-10-CM | POA: Diagnosis present

## 2016-11-09 DIAGNOSIS — Z6839 Body mass index (BMI) 39.0-39.9, adult: Secondary | ICD-10-CM

## 2016-11-09 DIAGNOSIS — E119 Type 2 diabetes mellitus without complications: Secondary | ICD-10-CM | POA: Diagnosis present

## 2016-11-09 DIAGNOSIS — M25552 Pain in left hip: Secondary | ICD-10-CM

## 2016-11-09 DIAGNOSIS — Z955 Presence of coronary angioplasty implant and graft: Secondary | ICD-10-CM | POA: Diagnosis not present

## 2016-11-09 DIAGNOSIS — I2510 Atherosclerotic heart disease of native coronary artery without angina pectoris: Secondary | ICD-10-CM | POA: Diagnosis present

## 2016-11-09 DIAGNOSIS — N400 Enlarged prostate without lower urinary tract symptoms: Secondary | ICD-10-CM | POA: Diagnosis present

## 2016-11-09 DIAGNOSIS — E785 Hyperlipidemia, unspecified: Secondary | ICD-10-CM | POA: Diagnosis present

## 2016-11-09 DIAGNOSIS — I10 Essential (primary) hypertension: Secondary | ICD-10-CM | POA: Diagnosis present

## 2016-11-09 DIAGNOSIS — G8918 Other acute postprocedural pain: Secondary | ICD-10-CM

## 2016-11-09 DIAGNOSIS — Z419 Encounter for procedure for purposes other than remedying health state, unspecified: Secondary | ICD-10-CM

## 2016-11-09 DIAGNOSIS — E669 Obesity, unspecified: Secondary | ICD-10-CM | POA: Diagnosis present

## 2016-11-09 DIAGNOSIS — R2689 Other abnormalities of gait and mobility: Secondary | ICD-10-CM

## 2016-11-09 HISTORY — PX: TOTAL HIP ARTHROPLASTY: SHX124

## 2016-11-09 LAB — CREATININE, SERUM: Creatinine, Ser: 0.98 mg/dL (ref 0.61–1.24)

## 2016-11-09 LAB — ABO/RH: ABO/RH(D): A POS

## 2016-11-09 SURGERY — ARTHROPLASTY, HIP, TOTAL, ANTERIOR APPROACH
Anesthesia: Spinal | Laterality: Left | Wound class: Clean

## 2016-11-09 MED ORDER — PHENOL 1.4 % MT LIQD
1.0000 | OROMUCOSAL | Status: DC | PRN
  Filled 2016-11-09: qty 177

## 2016-11-09 MED ORDER — KETOROLAC TROMETHAMINE 30 MG/ML IJ SOLN
30.0000 mg | Freq: Once | INTRAMUSCULAR | Status: AC
  Administered 2016-11-09: 30 mg via INTRAVENOUS
  Filled 2016-11-09: qty 1

## 2016-11-09 MED ORDER — TRAMADOL HCL 50 MG PO TABS
100.0000 mg | ORAL_TABLET | Freq: Four times a day (QID) | ORAL | Status: DC
  Administered 2016-11-09 – 2016-11-12 (×10): 100 mg via ORAL
  Filled 2016-11-09 (×11): qty 2

## 2016-11-09 MED ORDER — KETAMINE HCL 50 MG/ML IJ SOLN
INTRAMUSCULAR | Status: DC | PRN
  Administered 2016-11-09: 50 mg via INTRAVENOUS

## 2016-11-09 MED ORDER — SELENIUM 100 MCG PO TABS: 1.0000 | ORAL_TABLET | Freq: Every day | ORAL | Status: DC

## 2016-11-09 MED ORDER — FENTANYL CITRATE (PF) 100 MCG/2ML IJ SOLN
INTRAMUSCULAR | Status: DC | PRN
  Administered 2016-11-09: 50 ug via INTRAVENOUS

## 2016-11-09 MED ORDER — PROPOFOL 500 MG/50ML IV EMUL
INTRAVENOUS | Status: DC | PRN
  Administered 2016-11-09: 50 ug/kg/min via INTRAVENOUS

## 2016-11-09 MED ORDER — BISACODYL 10 MG RE SUPP: 10.0000 mg | Freq: Every day | RECTAL | Status: DC | PRN

## 2016-11-09 MED ORDER — ALUM & MAG HYDROXIDE-SIMETH 200-200-20 MG/5ML PO SUSP: 30.0000 mL | ORAL | Status: DC | PRN

## 2016-11-09 MED ORDER — DEXTROSE 5 % IV SOLN
500.0000 mg | Freq: Four times a day (QID) | INTRAVENOUS | Status: DC | PRN
  Filled 2016-11-09: qty 5

## 2016-11-09 MED ORDER — ARGININE 500 MG PO TABS: 1000.0000 mg | ORAL_TABLET | Freq: Every day | ORAL | Status: DC

## 2016-11-09 MED ORDER — GLYCOPYRROLATE 0.2 MG/ML IJ SOLN
INTRAMUSCULAR | Status: DC | PRN
  Administered 2016-11-09: 0.2 mg via INTRAVENOUS

## 2016-11-09 MED ORDER — ONDANSETRON HCL 4 MG/2ML IJ SOLN: 4.0000 mg | Freq: Four times a day (QID) | INTRAMUSCULAR | Status: DC | PRN

## 2016-11-09 MED ORDER — METOCLOPRAMIDE HCL 5 MG/ML IJ SOLN: 5.0000 mg | Freq: Three times a day (TID) | INTRAMUSCULAR | Status: DC | PRN

## 2016-11-09 MED ORDER — BUPIVACAINE HCL (PF) 0.5 % IJ SOLN
INTRAMUSCULAR | Status: DC | PRN
  Administered 2016-11-09: 3 mL via INTRATHECAL

## 2016-11-09 MED ORDER — MAGNESIUM HYDROXIDE 400 MG/5ML PO SUSP
30.0000 mL | Freq: Every day | ORAL | Status: DC | PRN
  Administered 2016-11-11 – 2016-11-12 (×2): 30 mL via ORAL
  Filled 2016-11-09 (×2): qty 30

## 2016-11-09 MED ORDER — DOCUSATE SODIUM 100 MG PO CAPS
100.0000 mg | ORAL_CAPSULE | Freq: Two times a day (BID) | ORAL | Status: DC
  Administered 2016-11-09 – 2016-11-12 (×7): 100 mg via ORAL
  Filled 2016-11-09 (×7): qty 1

## 2016-11-09 MED ORDER — METOPROLOL SUCCINATE ER 25 MG PO TB24
25.0000 mg | ORAL_TABLET | Freq: Every day | ORAL | Status: DC
  Administered 2016-11-10 – 2016-11-12 (×3): 25 mg via ORAL
  Filled 2016-11-09 (×3): qty 1

## 2016-11-09 MED ORDER — MENTHOL 3 MG MT LOZG
1.0000 | LOZENGE | OROMUCOSAL | Status: DC | PRN
  Filled 2016-11-09: qty 9

## 2016-11-09 MED ORDER — CEFAZOLIN SODIUM-DEXTROSE 2-4 GM/100ML-% IV SOLN
INTRAVENOUS | Status: AC
  Filled 2016-11-09: qty 100

## 2016-11-09 MED ORDER — ACETAMINOPHEN 650 MG RE SUPP: 650.0000 mg | Freq: Four times a day (QID) | RECTAL | Status: DC | PRN

## 2016-11-09 MED ORDER — CEFAZOLIN SODIUM-DEXTROSE 2-4 GM/100ML-% IV SOLN
2.0000 g | Freq: Four times a day (QID) | INTRAVENOUS | Status: AC
  Administered 2016-11-09 – 2016-11-10 (×3): 2 g via INTRAVENOUS
  Filled 2016-11-09 (×3): qty 100

## 2016-11-09 MED ORDER — METOCLOPRAMIDE HCL 10 MG PO TABS
5.0000 mg | ORAL_TABLET | Freq: Three times a day (TID) | ORAL | Status: DC | PRN
  Administered 2016-11-11: 10 mg via ORAL
  Filled 2016-11-09: qty 1

## 2016-11-09 MED ORDER — ACETAMINOPHEN 325 MG PO TABS
650.0000 mg | ORAL_TABLET | Freq: Four times a day (QID) | ORAL | Status: DC | PRN
Start: 2016-11-09 — End: 2016-11-12
  Administered 2016-11-09: 650 mg via ORAL
  Filled 2016-11-09: qty 2

## 2016-11-09 MED ORDER — MAGNESIUM CITRATE PO SOLN
1.0000 | Freq: Once | ORAL | Status: DC | PRN
Start: 2016-11-09 — End: 2016-11-12
  Filled 2016-11-09: qty 296

## 2016-11-09 MED ORDER — ASPIRIN 81 MG PO CHEW
81.0000 mg | CHEWABLE_TABLET | Freq: Two times a day (BID) | ORAL | Status: DC
  Administered 2016-11-09 – 2016-11-12 (×6): 81 mg via ORAL
  Filled 2016-11-09 (×6): qty 1

## 2016-11-09 MED ORDER — LACTATED RINGERS IV SOLN
INTRAVENOUS | Status: DC
  Administered 2016-11-09: 50 mL/h via INTRAVENOUS
  Administered 2016-11-09 (×2): via INTRAVENOUS

## 2016-11-09 MED ORDER — TRANEXAMIC ACID 1000 MG/10ML IV SOLN
INTRAVENOUS | Status: DC | PRN
  Administered 2016-11-09: 1000 mg via INTRAVENOUS

## 2016-11-09 MED ORDER — FAMOTIDINE 20 MG PO TABS
20.0000 mg | ORAL_TABLET | Freq: Once | ORAL | Status: AC
  Administered 2016-11-09: 20 mg via ORAL

## 2016-11-09 MED ORDER — ZOLPIDEM TARTRATE 5 MG PO TABS: 5.0000 mg | ORAL_TABLET | Freq: Every evening | ORAL | Status: DC | PRN

## 2016-11-09 MED ORDER — GABAPENTIN 400 MG PO CAPS
400.0000 mg | ORAL_CAPSULE | Freq: Two times a day (BID) | ORAL | Status: DC
  Administered 2016-11-09 – 2016-11-12 (×6): 400 mg via ORAL
  Filled 2016-11-09 (×6): qty 1

## 2016-11-09 MED ORDER — MIDAZOLAM HCL 5 MG/5ML IJ SOLN
INTRAMUSCULAR | Status: DC | PRN
  Administered 2016-11-09 (×2): 1 mg via INTRAVENOUS
  Administered 2016-11-09: 2 mg via INTRAVENOUS

## 2016-11-09 MED ORDER — FAMOTIDINE 20 MG PO TABS
ORAL_TABLET | ORAL | Status: AC
  Administered 2016-11-09: 20 mg via ORAL
  Filled 2016-11-09: qty 1

## 2016-11-09 MED ORDER — METHOCARBAMOL 500 MG PO TABS
500.0000 mg | ORAL_TABLET | Freq: Four times a day (QID) | ORAL | Status: DC | PRN
  Administered 2016-11-09 – 2016-11-11 (×3): 500 mg via ORAL
  Filled 2016-11-09 (×3): qty 1

## 2016-11-09 MED ORDER — OMEGA-3-ACID ETHYL ESTERS 1 G PO CAPS
1.0000 g | ORAL_CAPSULE | Freq: Every day | ORAL | Status: DC
  Administered 2016-11-09 – 2016-11-11 (×3): 1 g via ORAL
  Filled 2016-11-09 (×3): qty 1

## 2016-11-09 MED ORDER — LIDOCAINE HCL (PF) 2 % IJ SOLN
INTRAMUSCULAR | Status: DC | PRN
  Administered 2016-11-09: 50 mg

## 2016-11-09 MED ORDER — FENTANYL CITRATE (PF) 100 MCG/2ML IJ SOLN: 25.0000 ug | INTRAMUSCULAR | Status: DC | PRN

## 2016-11-09 MED ORDER — ADULT MULTIVITAMIN W/MINERALS CH
1.0000 | ORAL_TABLET | Freq: Every day | ORAL | Status: DC
  Administered 2016-11-10 – 2016-11-11 (×2): 1 via ORAL
  Filled 2016-11-09 (×2): qty 1

## 2016-11-09 MED ORDER — LIDOCAINE HCL 2 % EX GEL
CUTANEOUS | Status: DC | PRN
  Administered 2016-11-09: 1 via TOPICAL

## 2016-11-09 MED ORDER — DIPHENHYDRAMINE HCL 12.5 MG/5ML PO ELIX: 12.5000 mg | ORAL_SOLUTION | ORAL | Status: DC | PRN

## 2016-11-09 MED ORDER — LISINOPRIL 5 MG PO TABS
5.0000 mg | ORAL_TABLET | Freq: Every day | ORAL | Status: DC
  Administered 2016-11-10 – 2016-11-12 (×3): 5 mg via ORAL
  Filled 2016-11-09 (×3): qty 1

## 2016-11-09 MED ORDER — EPINEPHRINE PF 1 MG/ML IJ SOLN
INTRAMUSCULAR | Status: DC | PRN
  Administered 2016-11-09: .001 mg via INTRATHECAL

## 2016-11-09 MED ORDER — CO Q 10 100 MG PO CAPS: 100.0000 mg | ORAL_CAPSULE | Freq: Every day | ORAL | Status: DC

## 2016-11-09 MED ORDER — MORPHINE SULFATE (PF) 2 MG/ML IV SOLN
2.0000 mg | INTRAVENOUS | Status: DC | PRN
  Administered 2016-11-09 – 2016-11-10 (×4): 2 mg via INTRAVENOUS
  Filled 2016-11-09 (×4): qty 1

## 2016-11-09 MED ORDER — ASPIRIN EC 81 MG PO TBEC: 81.0000 mg | DELAYED_RELEASE_TABLET | Freq: Every day | ORAL | Status: DC

## 2016-11-09 MED ORDER — CLOPIDOGREL BISULFATE 75 MG PO TABS
75.0000 mg | ORAL_TABLET | Freq: Every day | ORAL | Status: DC
  Administered 2016-11-09 – 2016-11-12 (×4): 75 mg via ORAL
  Filled 2016-11-09 (×4): qty 1

## 2016-11-09 MED ORDER — NEOMYCIN-POLYMYXIN B GU 40-200000 IR SOLN
Status: DC | PRN
  Administered 2016-11-09: 4 mL

## 2016-11-09 MED ORDER — SAW PALMETTO (SERENOA REPENS) 160 MG PO CAPS: 160.0000 mg | ORAL_CAPSULE | Freq: Every day | ORAL | Status: DC

## 2016-11-09 MED ORDER — ONDANSETRON HCL 4 MG PO TABS: 4.0000 mg | ORAL_TABLET | Freq: Four times a day (QID) | ORAL | Status: DC | PRN

## 2016-11-09 MED ORDER — PHENYLEPHRINE HCL 10 MG/ML IJ SOLN
INTRAMUSCULAR | Status: DC | PRN
  Administered 2016-11-09 (×2): 100 ug via INTRAVENOUS

## 2016-11-09 MED ORDER — BUPIVACAINE-EPINEPHRINE (PF) 0.25% -1:200000 IJ SOLN
INTRAMUSCULAR | Status: AC
  Filled 2016-11-09: qty 30

## 2016-11-09 MED ORDER — SODIUM CHLORIDE 0.9 % IV SOLN
INTRAVENOUS | Status: DC | PRN
  Administered 2016-11-09: 30 ug/min via INTRAVENOUS

## 2016-11-09 MED ORDER — SODIUM CHLORIDE 0.9 % IV SOLN
INTRAVENOUS | Status: DC
  Administered 2016-11-09: 14:00:00 via INTRAVENOUS

## 2016-11-09 MED ORDER — BUPIVACAINE-EPINEPHRINE 0.25% -1:200000 IJ SOLN
INTRAMUSCULAR | Status: DC | PRN
  Administered 2016-11-09: 30 mL

## 2016-11-09 MED ORDER — CEFAZOLIN SODIUM-DEXTROSE 2-4 GM/100ML-% IV SOLN
2.0000 g | Freq: Once | INTRAVENOUS | Status: AC
  Administered 2016-11-09: 2 g via INTRAVENOUS

## 2016-11-09 MED ORDER — NEOMYCIN-POLYMYXIN B GU 40-200000 IR SOLN
Status: AC
Start: 2016-11-09 — End: 2016-11-09
  Filled 2016-11-09: qty 4

## 2016-11-09 MED ORDER — BUPIVACAINE HCL (PF) 0.25 % IJ SOLN
INTRAMUSCULAR | Status: AC
  Filled 2016-11-09: qty 30

## 2016-11-09 MED ORDER — GABAPENTIN 800 MG PO TABS
400.0000 mg | ORAL_TABLET | Freq: Two times a day (BID) | ORAL | Status: DC
  Filled 2016-11-09: qty 0.5

## 2016-11-09 MED ORDER — OXYCODONE HCL 5 MG PO TABS: 5.0000 mg | ORAL_TABLET | Freq: Once | ORAL | Status: DC | PRN

## 2016-11-09 MED ORDER — TRANEXAMIC ACID 1000 MG/10ML IV SOLN
INTRAVENOUS | Status: AC
  Filled 2016-11-09: qty 10

## 2016-11-09 MED ORDER — EPHEDRINE SULFATE 50 MG/ML IJ SOLN
INTRAMUSCULAR | Status: DC | PRN
  Administered 2016-11-09: 5 mg via INTRAVENOUS
  Administered 2016-11-09: 10 mg via INTRAVENOUS

## 2016-11-09 MED ORDER — OXYCODONE HCL 5 MG PO TABS
5.0000 mg | ORAL_TABLET | ORAL | Status: DC | PRN
  Administered 2016-11-09 (×2): 5 mg via ORAL
  Administered 2016-11-10: 10 mg via ORAL
  Administered 2016-11-10: 5 mg via ORAL
  Administered 2016-11-10 – 2016-11-11 (×7): 10 mg via ORAL
  Filled 2016-11-09: qty 2
  Filled 2016-11-09: qty 1
  Filled 2016-11-09 (×3): qty 2
  Filled 2016-11-09: qty 1
  Filled 2016-11-09 (×5): qty 2

## 2016-11-09 MED ORDER — DHEA 25 MG PO CAPS: 25.0000 mg | ORAL_CAPSULE | Freq: Every day | ORAL | Status: DC

## 2016-11-09 MED ORDER — B COMPLEX-C PO TABS: 1.0000 | ORAL_TABLET | Freq: Every day | ORAL | Status: DC

## 2016-11-09 MED ORDER — OXYCODONE HCL 5 MG/5ML PO SOLN: 5.0000 mg | Freq: Once | ORAL | Status: DC | PRN

## 2016-11-09 MED ORDER — SIMVASTATIN 20 MG PO TABS
20.0000 mg | ORAL_TABLET | Freq: Every day | ORAL | Status: DC
  Administered 2016-11-09 – 2016-11-11 (×3): 20 mg via ORAL
  Filled 2016-11-09 (×3): qty 1

## 2016-11-09 SURGICAL SUPPLY — 50 items
BLADE SAW SAG 18.5X105 (BLADE) ×2 IMPLANT
BNDG COHESIVE 6X5 TAN STRL LF (GAUZE/BANDAGES/DRESSINGS) ×4 IMPLANT
CANISTER SUCT 1200ML W/VALVE (MISCELLANEOUS) ×2 IMPLANT
CAPT HIP TOTAL 3 ×2 IMPLANT
CATH FOL LEG HOLDER (MISCELLANEOUS) ×2 IMPLANT
CATH TRAY METER 16FR LF (MISCELLANEOUS) ×2 IMPLANT
CHLORAPREP W/TINT 26ML (MISCELLANEOUS) ×2 IMPLANT
DRAPE C-ARM XRAY 36X54 (DRAPES) ×2 IMPLANT
DRAPE INCISE IOBAN 66X60 STRL (DRAPES) IMPLANT
DRAPE POUCH INSTRU U-SHP 10X18 (DRAPES) ×2 IMPLANT
DRAPE SHEET LG 3/4 BI-LAMINATE (DRAPES) ×6 IMPLANT
DRAPE STERI IOBAN 125X83 (DRAPES) ×2 IMPLANT
DRAPE TABLE BACK 80X90 (DRAPES) ×2 IMPLANT
DRSG OPSITE POSTOP 4X8 (GAUZE/BANDAGES/DRESSINGS) ×4 IMPLANT
ELECT BLADE 6.5 EXT (BLADE) ×2 IMPLANT
GAUZE SPONGE 4X4 12PLY STRL (GAUZE/BANDAGES/DRESSINGS) ×2 IMPLANT
GLOVE BIOGEL PI IND STRL 9 (GLOVE) ×1 IMPLANT
GLOVE BIOGEL PI INDICATOR 9 (GLOVE) ×1
GLOVE SURG SYN 9.0  PF PI (GLOVE) ×2
GLOVE SURG SYN 9.0 PF PI (GLOVE) ×2 IMPLANT
GOWN SRG 2XL LVL 4 RGLN SLV (GOWNS) ×1 IMPLANT
GOWN STRL NON-REIN 2XL LVL4 (GOWNS) ×1
GOWN STRL REUS W/ TWL LRG LVL3 (GOWN DISPOSABLE) ×1 IMPLANT
GOWN STRL REUS W/TWL LRG LVL3 (GOWN DISPOSABLE) ×1
HANDPIECE INTERPULSE COAX TIP (DISPOSABLE) ×1
HEMOVAC 400CC 10FR (MISCELLANEOUS) ×2 IMPLANT
HOOD PEEL AWAY FLYTE STAYCOOL (MISCELLANEOUS) ×2 IMPLANT
MAT BLUE FLOOR 46X72 FLO (MISCELLANEOUS) ×2 IMPLANT
NDL SAFETY 18GX1.5 (NEEDLE) ×2 IMPLANT
NEEDLE SPNL 18GX3.5 QUINCKE PK (NEEDLE) ×2 IMPLANT
NS IRRIG 1000ML POUR BTL (IV SOLUTION) ×2 IMPLANT
PACK HIP COMPR (MISCELLANEOUS) ×2 IMPLANT
REAMER ROD DEEP FLUTE 2.5X950 (INSTRUMENTS) ×4 IMPLANT
SET HNDPC FAN SPRY TIP SCT (DISPOSABLE) ×1 IMPLANT
SOL PREP PVP 2OZ (MISCELLANEOUS) ×2
SOLUTION PREP PVP 2OZ (MISCELLANEOUS) ×1 IMPLANT
STAPLER SKIN PROX 35W (STAPLE) ×2 IMPLANT
STRAP SAFETY BODY (MISCELLANEOUS) ×2 IMPLANT
SUT DVC 2 QUILL PDO  T11 36X36 (SUTURE) ×1
SUT DVC 2 QUILL PDO T11 36X36 (SUTURE) ×1 IMPLANT
SUT DVC QUILL MONODERM 30X30 (SUTURE) ×2 IMPLANT
SUT SILK 0 (SUTURE) ×1
SUT SILK 0 30XBRD TIE 6 (SUTURE) ×1 IMPLANT
SUT VIC AB 1 CT1 36 (SUTURE) ×2 IMPLANT
SYR 20CC LL (SYRINGE) ×2 IMPLANT
SYR 30ML LL (SYRINGE) ×2 IMPLANT
TAPE MICROFOAM 4IN (TAPE) ×2 IMPLANT
TIP COAXIAL FEMORAL CANAL (MISCELLANEOUS) ×4 IMPLANT
TOWEL OR 17X26 4PK STRL BLUE (TOWEL DISPOSABLE) ×2 IMPLANT
TUBE KAMVAC SUCTION (TUBING) IMPLANT

## 2016-11-09 NOTE — H&P (Signed)
Reviewed paper H+P, will be scanned into chart. No changes noted.  

## 2016-11-09 NOTE — Progress Notes (Signed)
PT Cancellation Note  Patient Details Name: Darrell Jennings MRN: [id number] DOB: 09/15/1946   Cancelled Treatment:    Reason Eval/Treat Not Completed: Medical issues which prohibited therapy.  Nursing reports pt currently with low BP and does not have appropriate sensation back in LE's yet since surgery.  Nursing recommending holding pt from PT at this time.  Will re-attempt PT eval tomorrow morning as medically appropriate.   Darrell Jennings 11/09/2016, 3:10 PM Darrell Jennings, PT [phone]

## 2016-11-09 NOTE — Anesthesia Procedure Notes (Signed)
Spinal  Patient location during procedure: OR Staffing Anesthesiologist: PISCITELLO, JOSEPH K Resident/CRNA: Juniper Snyders Performed: resident/CRNA  Preanesthetic Checklist Completed: patient identified, site marked, surgical consent, pre-op evaluation, timeout performed, IV checked, risks and benefits discussed and monitors and equipment checked Spinal Block Patient position: sitting Prep: ChloraPrep and site prepped and draped Patient monitoring: heart rate, continuous pulse ox, blood pressure and cardiac monitor Approach: midline Location: L4-5 Injection technique: single-shot Needle Needle type: Whitacre and Introducer  Needle gauge: 24 G Needle length: 9 cm Additional Notes Negative paresthesia. Negative blood return. Positive free-flowing CSF. Expiration date of kit checked and confirmed. Patient tolerated procedure well, without complications.       

## 2016-11-09 NOTE — Anesthesia Preprocedure Evaluation (Signed)
Anesthesia Evaluation  Patient identified by MRN, date of birth, ID band Patient awake    Reviewed: Allergy & Precautions, H&P , NPO status , Patient's Chart, lab work & pertinent test results  History of Anesthesia Complications Negative for: history of anesthetic complications  Airway Mallampati: III  TM Distance: <3 FB Neck ROM: limited    Dental no notable dental hx. (+) Poor Dentition, Chipped   Pulmonary neg shortness of breath, former smoker,    Pulmonary exam normal breath sounds clear to auscultation       Cardiovascular Exercise Tolerance: Good hypertension, (-) angina+ CAD and + Cardiac Stents  (-) DOE Normal cardiovascular exam Rhythm:regular Rate:Normal     Neuro/Psych negative neurological ROS  negative psych ROS   GI/Hepatic negative GI ROS, Neg liver ROS,   Endo/Other  diabetes, Type 2  Renal/GU      Musculoskeletal  (+) Arthritis ,   Abdominal   Peds  Hematology negative hematology ROS (+)   Anesthesia Other Findings Negative stress test   Past Medical History: No date: Anginal pain (HCC) No date: Arthritis No date: BPH (benign prostatic hyperplasia) No date: Coronary artery disease No date: Diabetes mellitus without complication (HCC)     Comment: controlled by diet, no medication 11/02/2011: History of nuclear stress test     Comment: bruce myoview; no ischemia No date: Hypercholesteremia No date: Hyperlipidemia No date: Hypertension No date: Obesity  Past Surgical History: No date: ANKLE FRACTURE SURGERY Left     Comment: ANKLE SYNDESMOTIC SCREW REMOVAL 06/16/2015: COLONOSCOPY WITH PROPOFOL N/A     Comment: Procedure: COLONOSCOPY WITH PROPOFOL;                Surgeon: Christena DeemMartin U Skulskie, MD;  [facility]              ENDOSCOPY;  Service: Endoscopy;  Laterality:               N/A; 06/14/2011: CORONARY ANGIOPLASTY WITH STENT PLACEMENT     Comment: 2.5x2018mm Resolute stent of LAD  (total               occlusion to 0%)- Dr. Erlene QuanJ. Berry) No date: HEMORRHOID SURGERY 2010: LEG SURGERY     Comment: broke leg  BMI    Body Mass Index:  39.13 kg/m      Reproductive/Obstetrics negative OB ROS                             Anesthesia Physical Anesthesia Plan  ASA: III  Anesthesia Plan: Spinal   Post-op Pain Management:    Induction:   Airway Management Planned:   Additional Equipment:   Intra-op Plan:   Post-operative Plan:   Informed Consent: I have reviewed the patients History and Physical, chart, labs and discussed the procedure including the risks, benefits and alternatives for the proposed anesthesia with the patient or authorized representative who has indicated his/her understanding and acceptance.     Plan Discussed with: Anesthesiologist, CRNA and Surgeon  Anesthesia Plan Comments:         Anesthesia Quick Evaluation

## 2016-11-09 NOTE — [place] FL2 (Signed)
[place] MEDICAID FL2 LEVEL OF CARE SCREENING TOOL     IDENTIFICATION  Patient Name: Darrell BoatmanRobert W Jurgensen Birthdate: 03/12/1946 Sex: male Admission Date (Current Location): 11/09/2016  [place]   Facility and Address:  [facility], [address], [place], [place] [address]      Provider Number: [id number]  Attending Physician Name and Address:  Kennedy BuckerMichael Menz, MD  Relative Name and Phone Number:       Current Level of Care: Hospital Recommended Level of Care: Skilled Nursing Facility Prior Approval Number:    Date Approved/Denied:   PASRR Number:  ([id number] [id number])  Discharge Plan: SNF    Current Diagnoses: Patient Active Problem List   Diagnosis Date Noted  . Primary osteoarthritis of left hip 11/09/2016  . Coronary artery disease 10/26/2013  . Essential hypertension 10/26/2013  . Hyperlipidemia 10/26/2013    Orientation RESPIRATION BLADDER Height & Weight     Self, Time, Situation, Place  Normal External catheter Weight: 265 lb (120.2 kg) Height:  5\' 9"  (175.3 cm)  BEHAVIORAL SYMPTOMS/MOOD NEUROLOGICAL BOWEL NUTRITION STATUS   (None.)  (None) Continent Diet (Diet: Clear Liquid)  AMBULATORY STATUS COMMUNICATION OF NEEDS Skin   Extensive Assist Verbally Surgical wounds (Incision: Left Hip)                       Personal Care Assistance Level of Assistance  Bathing, Feeding, Dressing Bathing Assistance: Limited assistance Feeding assistance: Independent Dressing Assistance: Limited assistance     Functional Limitations Info  Sight, Speech, Hearing Sight Info: Adequate Hearing Info: Adequate Speech Info: Adequate    SPECIAL CARE FACTORS FREQUENCY  PT (By licensed PT), OT (By licensed OT)     PT Frequency:  (5) OT Frequency:  (5)            Contractures      Additional Factors Info  Code Status, Allergies Code Status Info:  (Full Code) Allergies Info:  (No Known Allergies)            Current Medications (11/09/2016):  This is the current hospital active medication list Current Facility-Administered Medications  Medication Dose Route Frequency Provider Last Rate Last Dose  . 0.9 %  sodium chloride infusion   Intravenous Continuous Kennedy BuckerMichael Menz, MD      . acetaminophen (TYLENOL) tablet 650 mg  650 mg Oral Q6H PRN Kennedy BuckerMichael Menz, MD       Or  . acetaminophen (TYLENOL) suppository 650 mg  650 mg Rectal Q6H PRN Kennedy BuckerMichael Menz, MD      . alum & mag hydroxide-simeth (MAALOX/MYLANTA) 200-200-20 MG/5ML suspension 30 mL  30 mL Oral Q4H PRN Kennedy BuckerMichael Menz, MD      . aspirin chewable tablet 81 mg  81 mg Oral BID Kennedy BuckerMichael Menz, MD      . bisacodyl (DULCOLAX) suppository 10 mg  10 mg Rectal Daily PRN Kennedy BuckerMichael Menz, MD      . ceFAZolin (ANCEF) IVPB 2g/100 mL premix  2 g Intravenous Q6H Kennedy BuckerMichael Menz, MD      . clopidogrel (PLAVIX) tablet 75 mg  75 mg Oral Daily Kennedy BuckerMichael Menz, MD      . diphenhydrAMINE (BENADRYL) 12.5 MG/5ML elixir 12.5-25 mg  12.5-25 mg Oral Q4H PRN Kennedy BuckerMichael Menz, MD      . docusate sodium (COLACE) capsule 100 mg  100 mg Oral BID Kennedy BuckerMichael Menz, MD      . Melene Muller[START ON 11/10/2016] lisinopril (PRINIVIL,ZESTRIL) tablet 5 mg  5 mg Oral  Daily Kennedy BuckerMichael Menz, MD      . magnesium citrate solution 1 Bottle  1 Bottle Oral Once PRN Kennedy BuckerMichael Menz, MD      . magnesium hydroxide (MILK OF MAGNESIA) suspension 30 mL  30 mL Oral Daily PRN Kennedy BuckerMichael Menz, MD      . menthol-cetylpyridinium (CEPACOL) lozenge 3 mg  1 lozenge Oral PRN Kennedy BuckerMichael Menz, MD       Or  . phenol (CHLORASEPTIC) mouth spray 1 spray  1 spray Mouth/Throat PRN Kennedy BuckerMichael Menz, MD      . methocarbamol (ROBAXIN) tablet 500 mg  500 mg Oral Q6H PRN Kennedy BuckerMichael Menz, MD       Or  . methocarbamol (ROBAXIN) 500 mg in dextrose 5 % 50 mL IVPB  500 mg Intravenous Q6H PRN Kennedy BuckerMichael Menz, MD      . metoCLOPramide (REGLAN) tablet 5-10 mg  5-10 mg Oral Q8H PRN Kennedy BuckerMichael Menz, MD       Or  . metoCLOPramide (REGLAN) injection 5-10 mg  5-10 mg Intravenous Q8H PRN  Kennedy BuckerMichael Menz, MD      . Melene Muller[START ON 11/10/2016] metoprolol succinate (TOPROL-XL) 24 hr tablet 25 mg  25 mg Oral Daily Kennedy BuckerMichael Menz, MD      . morphine 2 MG/ML injection 2 mg  2 mg Intravenous Q1H PRN Kennedy BuckerMichael Menz, MD      . multivitamin with minerals tablet 1 tablet  1 tablet Oral Q lunch Kennedy BuckerMichael Menz, MD      . omega-3 acid ethyl esters (LOVAZA) capsule 1 g  1 g Oral QHS Kennedy BuckerMichael Menz, MD      . ondansetron [facility](ZOFRAN) tablet 4 mg  4 mg Oral Q6H PRN Kennedy BuckerMichael Menz, MD       Or  . ondansetron [facility](ZOFRAN) injection 4 mg  4 mg Intravenous Q6H PRN Kennedy BuckerMichael Menz, MD      . oxyCODONE (Oxy IR/ROXICODONE) immediate release tablet 5-10 mg  5-10 mg Oral Q3H PRN Kennedy BuckerMichael Menz, MD      . simvastatin (ZOCOR) tablet 20 mg  20 mg Oral QHS Kennedy BuckerMichael Menz, MD      . zolpidem [facility](AMBIEN) tablet 5 mg  5 mg Oral QHS PRN Kennedy BuckerMichael Menz, MD         Discharge Medications: Please see discharge summary for a list of discharge medications.  Relevant Imaging Results:  Relevant Lab Results:   Additional Information  (SSN: [id number])  Ralene BatheMackenzie Brighten Orndoff, Student-Social Work

## 2016-11-09 NOTE — Progress Notes (Signed)
Patient c/o pain 9/10, highest BP since admit to floor was 101/56. MD notified. One time dose of Toradol 30mg  IV x1, now.

## 2016-11-09 NOTE — Transfer of Care (Signed)
Immediate Anesthesia Transfer of Care Note  Patient: Darrell BoatmanRobert W Bova  Procedure(s) Performed: Procedure(s): TOTAL HIP ARTHROPLASTY ANTERIOR APPROACH (Left)  Patient Location: PACU  Anesthesia Type:Spinal  Level of Consciousness: awake  Airway & Oxygen Therapy: Patient Spontanous Breathing and Patient connected to face mask oxygen  Post-op Assessment: Report given to RN and Post -op Vital signs reviewed and stable  Post vital signs: Reviewed  Last Vitals:  Vitals:   11/09/16 0830 11/09/16 1213  BP: 133/74 (!) 97/59  Pulse: 68 70  Resp: 16 14  Temp: 36.6 C 36.4 C    Last Pain:  Vitals:   11/09/16 0830  TempSrc: Oral  PainSc: 3       Patients Stated Pain Goal: 0 (11/09/16 0830)  Complications: No apparent anesthesia complications

## 2016-11-09 NOTE — Op Note (Signed)
11/09/2016  12:10 PM  PATIENT:  Darrell Jennings  70 y.o. male  PRE-OPERATIVE DIAGNOSIS:  OSTEOARTHRITIS left hip  POST-OPERATIVE DIAGNOSIS:  OSTEOARTHRITIS left hip  PROCEDURE:  Procedure(s): TOTAL HIP ARTHROPLASTY ANTERIOR APPROACH (Left)  SURGEON: Leitha SchullerMichael J Kyron Schlitt, MD  ASSISTANTS: None  ANESTHESIA:   spinal  EBL:  Total I/O In: 1000 [I.V.:1000] Out: 400 [Urine:200; Blood:200]  BLOOD ADMINISTERED:none  DRAINS: (2) Hemovact drain(s) in the Subcutaneous layer with  Suction Open   LOCAL MEDICATIONS USED:  MARCAINE    and OTHER TXA injected into the joint after capsule closure  SPECIMEN:  Source of Specimen:  Left femoral head  DISPOSITION OF SPECIMEN:  PATHOLOGY  COUNTS:  YES  TOURNIQUET:  * No tourniquets in log *  IMPLANTS: Medacta Amis 1 standard collared stem with 58 mm Mpact cup DM with liner and S 28 mm head  DICTATION: .Dragon Dictation   The patient was brought to the operating room and after spinal anesthesia was obtained patient was placed on the operative table with the ipsilateral foot into the Medacta attachment, contralateral leg on a well-padded table. C-arm was brought in and preop template x-ray taken. After prepping and draping in usual sterile fashion appropriate patient identification and timeout procedures were completed. Anterior approach to the hip was obtained and centered over the greater trochanter and TFL muscle. The subcutaneous tissue was incised hemostasis being achieved by electrocautery. TFL fascia was incised and the muscle retracted laterally deep retractor placed. The lateral femoral circumflex vessels were identified and ligated. The anterior capsule was exposed and a capsulotomy performed. The neck was identified and a femoral neck cut carried out with a saw. The head was removed without difficulty and showed sclerotic femoral head and acetabulum. Reaming was carried out to 58 mm and a 58 mm cup trial gave appropriate tightness to the acetabular  component a Mpact DM 58 mm cup was impacted into position. The leg was then externally rotated and ischiofemoral and puboofemoral releases carried out. The femur was sequentially broached to a size 1, and then reamed to allow for the size 1 to be fully seated with ball-tip guide were placed and reaming over this. The tip of the reamer did showed a small amount of metal which was in the possible femoral canal and despite to irrigate out the canal these fragments did not come out and were left in place, size 1 stem and S head with standard neck trials were placed and the final components chosen. The 1 standard stem was inserted along with a S 28 mm head and 58 mm liner. The hip was reduced and was stable the wound was thoroughly irrigated with a dilute Betadine solution. The deep fascia was closed using a heavy Quill after infiltration of 30 cc of quarter percent Sensorcaine with epinephrine, and TXA injected into the joint. Subcutaneous drains were then inserted, 3-0 V-loc suture close the subcuticular layer to close the skin with skin staples . Xeroform and honeycomb dressing applied  PLAN OF CARE: Admit to inpatient

## 2016-11-09 NOTE — Progress Notes (Signed)
Patient was admitted to room 158 from surgery via room bed. NSL, drain, and foley in place. IV fluids started. Reviewed POC and MD orders. Bed alarm on for safety. Clear liquid diet started.

## 2016-11-10 NOTE — Progress Notes (Signed)
Patient intaking plenty of H2O this shift. 2 person assist for safety for transfers.

## 2016-11-10 NOTE — Progress Notes (Signed)
Physical Therapy Treatment Patient Details Name: Darrell BoatmanRobert W Kensinger MRN: [id number] DOB: 01/12/1946 Today's Date: 11/10/2016    History of Present Illness Pt is a 70 y.o. male s/p L THA anterior approach secondary to OA 11/09/16.  PMH includes anginal pain, BPH, CAD, DM, and htn.    PT Comments    Pt able to progress to ambulating 10 feet with RW, min assist x1 plus 2nd assist to advance L LE.  Distance limited d/t L hip pain and difficulty clearing L foot from floor without additional assistance.  Will continue to progress pt with strengthening, increasing ambulation distance, and decreasing assist levels per pt tolerance.   Follow Up Recommendations  SNF     Equipment Recommendations   (PT fitted pt to home walker during session)    Recommendations for Other Services       Precautions / Restrictions Precautions Precautions: Fall;Anterior Hip Precaution Booklet Issued: Yes (comment) Restrictions Weight Bearing Restrictions: Yes LLE Weight Bearing: Weight bearing as tolerated    Mobility  Bed Mobility Overal bed mobility: Needs Assistance Bed Mobility: Sit to Supine       Sit to supine: Mod assist;+2 for physical assistance;HOB elevated   General bed mobility comments: assist for trunk and B LE's; increased assist required d/t significant L hip pain  Transfers Overall transfer level: Needs assistance Equipment used: Rolling walker (2 wheeled) Transfers: Sit to/from Stand Sit to Stand: Mod assist;+2 safety/equipment         General transfer comment: pt required vc's for hand and feet placement and walker use; increased effort to come to full stand; limited upright posture d/t L hip pain  Ambulation/Gait Ambulation/Gait assistance: Min assist;+2 safety/equipment Ambulation Distance (Feet): 10 Feet Assistive device: Rolling walker (2 wheeled)   Gait velocity: decreased   General Gait Details: antalgic; decreased stance time L LE; vc's required to increase UE  support through RW to offweight L LE d/t c/o significant pain; physical assist required to advance L LE d/t pain and difficulty clearing floor; limited distance d/t L hip pain   Stairs            Wheelchair Mobility    Modified Rankin (Stroke Patients Only)       Balance Overall balance assessment: Needs assistance Sitting-balance support: Bilateral upper extremity supported;Feet supported Sitting balance-Leahy Scale: Good Sitting balance - Comments: static sitting   Standing balance support: Bilateral upper extremity supported (on RW) Standing balance-Leahy Scale: Fair Standing balance comment: static standing                    Cognition Arousal/Alertness: Awake/alert Behavior During Therapy: WFL for tasks assessed/performed Overall Cognitive Status: Within Functional Limits for tasks assessed                      Exercises      General Comments General comments (skin integrity, edema, etc.): Pt sitting up in chair upon PT arrival.  Pt agreeable to PT session.      Pertinent Vitals/Pain Pain Assessment: 0-10 Pain Score: 7  Pain Location: L hip Pain Descriptors / Indicators: Sore;Operative site guarding Pain Intervention(s): Limited activity within patient's tolerance;Monitored during session;Premedicated before session;Repositioned;Ice applied    Home Living                      Prior Function            PT Goals (current goals can now be found in the care plan  section) Acute Rehab PT Goals Patient Stated Goal: to be able to walk further PT Goal Formulation: With patient Time For Goal Achievement: 11/24/16 Potential to Achieve Goals: Good Progress towards PT goals: Progressing toward goals    Frequency    BID      PT Plan Current plan remains appropriate    Co-evaluation             End of Session Equipment Utilized During Treatment: Gait belt Activity Tolerance: Patient limited by pain Patient left: in  bed;with call bell/phone within reach;with bed alarm set;with family/visitor present;with SCD's reapplied (B heels elevated via towel rolls; ice pack on L hip)     Time: [id number] PT Time Calculation (min) (ACUTE ONLY): 23 min  Charges:  $Gait Training: 8-22 mins $Therapeutic Exercise: 8-22 mins                    G CodesHendricks Limes:      Mea Ozga 11/10/2016, 4:30 PM Hendricks LimesEmily Zania Kalisz, PT [phone]

## 2016-11-10 NOTE — Evaluation (Signed)
Physical Therapy Evaluation Patient Details Name: Darrell BoatmanRobert W Jennings MRN: [id number] DOB: 01/04/1946 Today's Date: 11/10/2016   History of Present Illness  Pt is a 70 y.o. male s/p L THA anterior approach secondary to OA 11/09/16.  PMH includes anginal pain, BPH, CAD, DM, and htn.  Clinical Impression  Prior to hospital admission, pt was independent with functional mobility (without AD).  Pt lives with his wife in 1 level home.  Currently pt requires 2 assist supine to sit and to stand and take a few steps bed to recliner with RW (see below for details).  Pt significantly limited in functional mobility d/t c/o L hip pain with any L LE movement and WB'ing.  Pt would benefit from skilled PT to address noted impairments and functional limitations.  Recommend pt discharge to STR (pending progress/pain control) when medically appropriate.    Follow Up Recommendations SNF (pending pt progress)    Equipment Recommendations  Rolling walker with 5" wheels (pt has family walker at home he plans to use (PT requested pt bring in to assess for appropriate fit))    Recommendations for Other Services       Precautions / Restrictions Precautions Precautions: Anterior Hip;Fall Restrictions Weight Bearing Restrictions: Yes LLE Weight Bearing: Weight bearing as tolerated      Mobility  Bed Mobility Overal bed mobility: Needs Assistance Bed Mobility: Supine to Sit     Supine to sit: Min assist;Mod assist;+2 for physical assistance;HOB elevated     General bed mobility comments: 2 attempts with max assist x1 but pt kept laying back down d/t significant c/o L hip pain; 3rd attempt pt able to sit on edge of bed with 2 assist; vc's for technique/sequencing  Transfers Overall transfer level: Needs assistance Equipment used: Rolling walker (2 wheeled) Transfers: Sit to/from Stand Sit to Stand: Min assist;Mod assist;+2 physical assistance         General transfer comment: pt required vc's for hand  and feet placement and walker use; increased effort to come to full stand; limited upright posture d/t L hip pain  Ambulation/Gait Ambulation/Gait assistance: Min assist;+2 physical assistance Ambulation Distance (Feet): 3 Feet (bed to chair) Assistive device: Rolling walker (2 wheeled)   Gait velocity: decreased   General Gait Details: antalgic; decreased stance time L LE; vc's required to increase UE support through RW to offweight L LE d/t c/o significant pain; limited distance d/t L hip pain  Stairs            Wheelchair Mobility    Modified Rankin (Stroke Patients Only)       Balance Overall balance assessment: Needs assistance Sitting-balance support: Bilateral upper extremity supported;Feet supported Sitting balance-Leahy Scale: Fair Sitting balance - Comments: static sitting   Standing balance support: Bilateral upper extremity supported (on RW) Standing balance-Leahy Scale: Fair Standing balance comment: static standing                              Pertinent Vitals/Pain Pain Assessment: 0-10 Pain Score: 7  Pain Location: L anterior lateral back of knee up thigh pain Pain Descriptors / Indicators: Sore;Tender;Operative site guarding Pain Intervention(s): Limited activity within patient's tolerance;Monitored during session;Premedicated before session;Repositioned;Ice applied     Home Living Family/patient expects to be discharged to:: Private residence Living Arrangements: Spouse/significant other Available Help at Discharge: Family Type of Home: House Home Access:  (has lift ramp from garage (does not have to navigate stairs))  Home Layout: One level Home Equipment: Walker - 2 wheels;Walker - 4 wheels;Cane - single point      Prior Function Level of Independence: Independent         Comments: Pt denies any recent falls.     Hand Dominance        Extremity/Trunk Assessment   Upper Extremity Assessment: Overall WFL for tasks  assessed           Lower Extremity Assessment: RLE deficits/detail;LLE deficits/detail RLE Deficits / Details: strength and ROM WFL LLE Deficits / Details: L hip flexion at least 2+/5; L knee flexion/extension at least 2+/5; L DF at least 3/5 (all limited d/t pain)  Cervical / Trunk Assessment: Normal  Communication   Communication: No difficulties  Cognition Arousal/Alertness: Awake/alert Behavior During Therapy: WFL for tasks assessed/performed Overall Cognitive Status: Within Functional Limits for tasks assessed                      General Comments General comments (skin integrity, edema, etc.): Pt laying in bed upon PT arrival.  Pt agreeable to PT session.    Exercises Total Joint Exercises Ankle Circles/Pumps: AROM;Strengthening;Both;10 reps;Supine Quad Sets: AROM;Strengthening;Both;10 reps;Supine Gluteal Sets: AROM;Strengthening;Both;10 reps;Supine Towel Squeeze: AROM;Strengthening;Both;10 reps;Supine Short Arc Quad: AROM;Strengthening;Left;10 reps;Supine Heel Slides: AAROM;Strengthening;Left;10 reps;Supine Hip ABduction/ADduction: AAROM;Strengthening;Left;10 reps;Supine Straight Leg Raises: AAROM;Strengthening;Left;10 reps;Supine  Pt required vc's for technique for ex's.   Assessment/Plan    PT Assessment Patient needs continued PT services  PT Problem List Decreased strength;Decreased activity tolerance;Decreased balance;Decreased mobility;Decreased knowledge of use of DME;Decreased knowledge of precautions;Pain          PT Treatment Interventions DME instruction;Gait training;Stair training;Functional mobility training;Therapeutic activities;Therapeutic exercise;Balance training;Patient/family education    PT Goals (Current goals can be found in the Care Plan section)  Acute Rehab PT Goals Patient Stated Goal: to go home PT Goal Formulation: With patient Time For Goal Achievement: 11/24/16 Potential to Achieve Goals: Fair    Frequency BID    Barriers to discharge Decreased caregiver support      Co-evaluation               End of Session Equipment Utilized During Treatment: Gait belt Activity Tolerance: Patient limited by pain Patient left: in chair;with call bell/phone within reach;with chair alarm set;with SCD's reapplied (B heels elevated via pillow; ice pack placed on L hip) Nurse Communication: Mobility status;Precautions;Weight bearing status (Pt's pain level)         Time: [id number] PT Time Calculation (min) (ACUTE ONLY): 37 min   Charges:   PT Evaluation $PT Eval Low Complexity: 1 Procedure PT Treatments $Therapeutic Exercise: 8-22 mins   PT G CodesHendricks Limes:        Darrell Jennings 11/10/2016, 11:24 AM Hendricks LimesEmily Lucillia Corson, PT [phone]

## 2016-11-10 NOTE — Evaluation (Signed)
Occupational Therapy Evaluation Patient Details Name: Darrell BoatmanRobert W Jennings MRN: [id number] DOB: 11/13/1946 Today's Date: 11/10/2016    History of Present Illness Pt. is a 70 y.o. male who was admitted to [facility] for an Anterior appproach THR.   Clinical Impression   Pt. is 70 year old male s/p Left THR(Anterior Approach)  Pt was independent in all ADLs prior to surgery and is eager to return to his PLOF.  Pt is currently limited in functional ADLs due to pain. Pt requires extensive assist for LB ADL skills due to pain and decreased AROM of L LE. Pt. would benefit from continued skilled OT services for education in assistive devices, functional mobility, and education in recommendations for home modifications to increase safety and prevent falls.  Pt is a good candidate for SNF to continue rehabilitation.      Follow Up Recommendations  Home health OT    Equipment Recommendations       Recommendations for Other Services       Precautions / Restrictions Precautions Precautions: Fall;Anterior Hip Restrictions Weight Bearing Restrictions: Yes LLE Weight Bearing: Weight bearing as tolerated      Mobility Bed Mobility Overal bed mobility: Needs Assistance Bed Mobility: Supine to Sit     Supine to sit: Min assist;Mod assist;+2 for physical assistance;HOB elevated     General bed mobility comments: 2 attempts with max assist x1 but pt kept laying back down d/t significant c/o L hip pain; 3rd attempt pt able to sit on edge of bed with 2 assist; vc's for technique/sequencing  Transfers Overall transfer level: Needs assistance Equipment used: Rolling walker (2 wheeled) Transfers: Sit to/from Stand Sit to Stand: Min assist;Mod assist;+2 physical assistance         General transfer comment: pt required vc's for hand and feet placement and walker use; increased effort to come to full stand; limited upright posture d/t L hip pain    Balance Overall balance assessment: Needs  assistance Sitting-balance support: Bilateral upper extremity supported;Feet supported Sitting balance-Leahy Scale: Fair Sitting balance - Comments: static sitting   Standing balance support: Bilateral upper extremity supported (on RW) Standing balance-Leahy Scale: Fair Standing balance comment: static standing                             ADL Overall ADL's : Needs assistance/impaired Eating/Feeding: Set up   Grooming: Set up               Lower Body Dressing: Moderate assistance                 General ADL Comments: Pt. education was provided about A/E use for LE ADLs     Vision     Perception     Praxis      Pertinent Vitals/Pain Pain Assessment: 0-10 Pain Score: 7  Pain Location: Left Hip Pain Descriptors / Indicators: Sore;Operative site guarding Pain Intervention(s): Limited activity within patient's tolerance;Monitored during session;Repositioned     Hand Dominance Right   Extremity/Trunk Assessment Upper Extremity Assessment Upper Extremity Assessment: Overall WFL for tasks assessed   Lower Extremity Assessment Lower Extremity Assessment: RLE deficits/detail;LLE deficits/detail RLE Deficits / Details: strength and ROM WFL LLE Deficits / Details: L hip flexion at least 2+/5; L knee flexion/extension at least 2+/5; L DF at least 3/5 (all limited d/t pain) LLE: Unable to fully assess due to pain   Cervical / Trunk Assessment Cervical / Trunk Assessment: Normal  Communication Communication Communication: No difficulties   Cognition Arousal/Alertness: Awake/alert Behavior During Therapy: WFL for tasks assessed/performed Overall Cognitive Status: Within Functional Limits for tasks assessed                     General Comments       Exercises       Shoulder Instructions      Home Living Family/patient expects to be discharged to:: Private residence Living Arrangements: Spouse/significant other Available Help at  Discharge: Family Type of Home: House Home Access:  (Lift from the garage)     Home Layout: One level     Bathroom Shower/Tub: Producer, television/film/videoWalk-in shower   Bathroom Toilet: Standard     Home Equipment: Environmental consultantWalker - 2 wheels;Walker - 4 wheels;Cane - single point          Prior Functioning/Environment Level of Independence: Independent        Comments: Pt denies any recent falls.        OT Problem List: Decreased strength;Decreased range of motion;Pain;Impaired UE functional use;Impaired vision/perception;Decreased safety awareness;Decreased activity tolerance   OT Treatment/Interventions: Self-care/ADL training;Therapeutic exercise;DME and/or AE instruction;Therapeutic activities;Patient/family education    OT Goals(Current goals can be found in the care plan section) Acute Rehab OT Goals Patient Stated Goal: To regain independence OT Goal Formulation: With patient Potential to Achieve Goals: Good  OT Frequency: Min 1X/week   Barriers to D/C:            Co-evaluation              End of Session    Activity Tolerance: Patient tolerated treatment well Patient left: with call bell/phone within reach;in chair;with chair alarm set   Time:  -    Charges:  OT General Charges $OT Visit: 1 Procedure OT Evaluation $OT Eval Moderate Complexity: 1 Procedure G-Codes:    Olegario MessierElaine Layden Caterino 11/10/2016, 11:55 AM

## 2016-11-10 NOTE — Progress Notes (Signed)
   Subjective: 1 Day Post-Op Procedure(s) (LRB): TOTAL HIP ARTHROPLASTY ANTERIOR APPROACH (Left) Patient reports pain as 8 on 0-10 scale.   Patient is well, and has had no acute complaints or problems Denies any CP, SOB, ABD pain. We will continue therapy today.  Plan is to go Home after hospital stay.  Objective: Vital signs in last 24 hours: Temp:  [97.5 F (36.4 C)-98.9 F (37.2 C)] 98.7 F (37.1 C) (11/15 0400) Pulse Rate:  [51-70] 65 (11/15 0400) Resp:  [8-19] 19 (11/15 0400) BP: (88-141)/(54-95) 141/54 (11/15 0400) SpO2:  [95 %-100 %] 95 % (11/15 0400) FiO2 (%):  [21 %] 21 % (11/14 1308) Weight:  [120.2 kg (265 lb)] 120.2 kg (265 lb) (11/14 0830)  Intake/Output from previous day: 11/14 0701 - 11/15 0700 In: 3123.3 [P.O.:360; I.V.:2563.3; IV Piggyback:200] Out: 1270 [Urine:965; Drains:105; Blood:200] Intake/Output this shift: No intake/output data recorded.  No results for input(s): HGB in the last 72 hours. No results for input(s): WBC, RBC, HCT, PLT in the last 72 hours.  Recent Labs  11/09/16 1740  CREATININE 0.98   No results for input(s): LABPT, INR in the last 72 hours.  EXAM General - Patient is Alert, Appropriate and Oriented Extremity - Neurovascular intact Sensation intact distally Intact pulses distally Dorsiflexion/Plantar flexion intact No cellulitis present Compartment soft Dressing - dressing C/D/I, hemovac intact Motor Function - intact, moving foot and toes well on exam.   Past Medical History:  Diagnosis Date  . Anginal pain (HCC)   . Arthritis   . BPH (benign prostatic hyperplasia)   . Coronary artery disease   . Diabetes mellitus without complication (HCC)    controlled by diet, no medication  . History of nuclear stress test 11/02/2011   bruce myoview; no ischemia  . Hypercholesteremia   . Hyperlipidemia   . Hypertension   . Obesity     Assessment/Plan:   1 Day Post-Op Procedure(s) (LRB): TOTAL HIP ARTHROPLASTY ANTERIOR  APPROACH (Left) Active Problems:   Primary osteoarthritis of left hip  Estimated body mass index is 39.13 kg/m as calculated from the following:   Height as of this encounter: 5\' 9"  (1.753 m).   Weight as of this encounter: 120.2 kg (265 lb). Advance diet Up with therapy  Needs BM Labs pending this am CM to assist with discharge  DVT Prophylaxis - Aspirin, Foot Pumps, TED hose and plavix Weight-Bearing as tolerated to left leg   T. Cranston Neighborhris Alexine Pilant, PA-C [facility] 11/10/2016, 7:44 AM

## 2016-11-10 NOTE — Clinical Social Work Placement (Addendum)
   CLINICAL SOCIAL WORK PLACEMENT  NOTE  Date:  11/10/2016  Patient Details  Name: Darrell BoatmanRobert W Jennings MRN: [id number] Date of Birth: 06/01/1946  Clinical Social Work is seeking post-discharge placement for this patient at the Skilled  Nursing Facility level of care (*CSW will initial, date and re-position this form in  chart as items are completed):  Yes   Patient/family provided with [place] Clinical Social Work Department's list of facilities offering this level of care within the geographic area requested by the patient (or if unable, by the patient's family).  Yes   Patient/family informed of their freedom to choose among providers that offer the needed level of care, that participate in Medicare, Medicaid or managed care program needed by the patient, have an available bed and are willing to accept the patient.  Yes   Patient/family informed of [facility] [place]'s ownership interest in [facility] and [facility], as well as of the fact that they are under no obligation to receive care at these facilities.  PASRR submitted to EDS on 11/10/16     PASRR number received on 11/10/16     Existing PASRR number confirmed on       FL2 transmitted to all facilities in geographic area requested by pt/family on 11/10/16     FL2 transmitted to all facilities within larger geographic area on       Patient informed that his/her managed care company has contracts with or will negotiate with certain facilities, including the following:            Patient/family informed of bed offers received.  11/12/2016   Patient [facility] bed [facility]      [facility]  Physician recommends and patient chooses bed at      SNF Patient to be transferred to   on  .  11/12/2016   Patient to be transferred to facility by      Wife  Patient family notified on   of transfer. Wife  Name of family member notified:        PHYSICIAN       Additional Comment:     _______________________________________________ Sample, Darleen CrockerBailey M, LCSW 11/10/2016, 5:11 PM

## 2016-11-10 NOTE — Anesthesia Postprocedure Evaluation (Signed)
Anesthesia Post Note  Patient: Darrell BoatmanRobert W Puccinelli  Procedure(s) Performed: Procedure(s) (LRB): TOTAL HIP ARTHROPLASTY ANTERIOR APPROACH (Left)  Patient location during evaluation: Nursing Unit Anesthesia Type: Spinal Level of consciousness: awake and alert and oriented Pain management: pain level controlled Vital Signs Assessment: post-procedure vital signs reviewed and stable Respiratory status: respiratory function stable Cardiovascular status: blood pressure returned to baseline Postop Assessment: no headache, no backache, spinal receding, patient able to bend at knees, no signs of nausea or vomiting and adequate PO intake Anesthetic complications: no    Last Vitals:  Vitals:   11/09/16 2348 11/10/16 0400  BP: (!) 115/57 (!) 141/54  Pulse: 64 65  Resp: 18 19  Temp: 37.2 C 37.1 C    Last Pain:  Vitals:   11/10/16 0637  TempSrc:   PainSc: 7                  Clydene PughBeane, Siddharth Babington D

## 2016-11-10 NOTE — Clinical Social Work Note (Signed)
Clinical Social Work Assessment  Patient Details  Name: Darrell Jennings MRN: [id number] Date of Birth: 1946/03/01  Date of referral:  11/10/16               Reason for consult:  Facility Placement                Permission sought to share information with:  Darrell Jennings granted to share information::  Yes, Verbal Permission Granted  Name::      Darrell Jennings::   Darrell Jennings   Relationship::     Contact Information:     Housing/Transportation Living arrangements for the past 2 months:  [place] of Information:  Patient Patient Interpreter Needed:  None Criminal Activity/Legal Involvement Pertinent to Current Situation/Hospitalization:  No - Comment as needed Significant Relationships:  Adult Children, Other Family Members, Friend, Significant Other Lives with:  Self Do you feel safe going back to the place where you live?  Yes Need for family participation in patient care:  Yes (Comment)  Care giving concerns:  Patient lives alone in [place].    Social Worker assessment / plan: Holiday representative (CSW) received SNF consult. PT is recommended SNF. CSW met with patient and his significant other Darrell Jennings to discuss D/C plan. Per patient he prefers to go home with home health and is open to SNF if needed. Patient chose [facility] for home health. Per Darrell Jennings she would like to see how patient walks tomorrow and then make a decision about home health or SNF. Patient is agreeable to SNF search in [facility]. CSW will continue to follow and assist as needed.     Employment status:  Retired Forensic scientist:  Medicare PT Recommendations:  [place] / Referral to community resources:  [place]  Patient/Family's Response to care:  Patient prefers to go home.   Patient/Family's Understanding of and Emotional Response to Diagnosis, Current Treatment, and  Prognosis: Patient was pleasant and thanked CSW for visit.   Emotional Assessment Appearance:  Appears stated age Attitude/Demeanor/Rapport:    Affect (typically observed):  Accepting, Adaptable, Pleasant Orientation:  Oriented to Self, Oriented to Place, Oriented to  Time, Oriented to Situation Alcohol / Substance use:  Not Applicable Psych involvement (Current and /or in the community):  No (Comment)  Discharge Needs  Concerns to be addressed:  Discharge Planning Concerns Readmission within the last 30 days:  No Current discharge risk:  Dependent with Mobility Barriers to Discharge:  Continued Medical Work up   [employer], Veronia Beets, LCSW 11/10/2016, 5:12 PM

## 2016-11-11 LAB — BASIC METABOLIC PANEL
Anion gap: 6 (ref 5–15)
BUN: 12 mg/dL (ref 6–20)
CHLORIDE: 98 mmol/L — AB (ref 101–111)
CO2: 28 mmol/L (ref 22–32)
CREATININE: 0.99 mg/dL (ref 0.61–1.24)
Calcium: 8.5 mg/dL — ABNORMAL LOW (ref 8.9–10.3)
GFR calc Af Amer: >60 mL/min (ref >60)
GFR calc non Af Amer: >60 mL/min (ref >60)
GLUCOSE: 152 mg/dL — AB (ref 65–99)
Potassium: 3.9 mmol/L (ref 3.5–5.1)
Sodium: 132 mmol/L — ABNORMAL LOW (ref 135–145)

## 2016-11-11 LAB — SURGICAL PATHOLOGY

## 2016-11-11 LAB — HEMOGLOBIN: Hemoglobin: 13.3 g/dL (ref 13.0–18.0)

## 2016-11-11 MED ORDER — OXYCODONE HCL 5 MG PO TABS: 5.0000 mg | ORAL_TABLET | ORAL | 0 refills | Status: DC | PRN

## 2016-11-11 MED ORDER — TRAMADOL HCL 50 MG PO TABS: 100.0000 mg | ORAL_TABLET | Freq: Four times a day (QID) | ORAL | 1 refills | Status: DC | PRN

## 2016-11-11 NOTE — Progress Notes (Signed)
Physical Therapy Treatment Patient Details Name: Darrell BoatmanRobert W Jennings MRN: [id number] DOB: 03/21/1946 Today's Date: 11/11/2016    History of Present Illness Pt is a 70 y.o. male s/p L THA anterior approach secondary to OA 11/09/16.  PMH includes anginal pain, BPH, CAD, DM, and htn.    PT Comments    Pt in chair, ready to return to bed.  Stood with min assist and was able to ambulate 50' with min guard for balance but assistance to advance LLE.  He is unable to advance without assistance due to pain/weakness.  He required mod a x 1 to assist with le's into supine.  Fatigued after gait and declined supine exercises.   Follow Up Recommendations  SNF     Equipment Recommendations       Recommendations for Other Services       Precautions / Restrictions Precautions Precautions: Fall;Anterior Hip Restrictions Weight Bearing Restrictions: Yes LLE Weight Bearing: Weight bearing as tolerated    Mobility  Bed Mobility Overal bed mobility: Needs Assistance Bed Mobility: Sit to Supine       Sit to supine: Mod assist (for le's)      Transfers Overall transfer level: Needs assistance Equipment used: Rolling walker (2 wheeled) Transfers: Sit to/from Stand Sit to Stand: Min assist            Ambulation/Gait Ambulation/Gait assistance: Min guard Ambulation Distance (Feet): 50 Feet Assistive device: Rolling walker (2 wheeled) Gait Pattern/deviations: Step-to pattern (asssit needed to advance LLE) Gait velocity: decreased   General Gait Details: assist needed to advance LLE, generally steady but limited by pain/weakness   Stairs            Wheelchair Mobility    Modified Rankin (Stroke Patients Only)       Balance Overall balance assessment: Needs assistance Sitting-balance support: Feet supported Sitting balance-Leahy Scale: Good     Standing balance support: Bilateral upper extremity supported Standing balance-Leahy Scale: Poor                       Cognition Arousal/Alertness: Awake/alert Behavior During Therapy: WFL for tasks assessed/performed Overall Cognitive Status: Within Functional Limits for tasks assessed                      Exercises      General Comments        Pertinent Vitals/Pain Pain Assessment: 0-10 Pain Score: 5  Pain Location: L hip Pain Descriptors / Indicators: Sore Pain Intervention(s): Limited activity within patient's tolerance    Home Living                      Prior Function            PT Goals (current goals can now be found in the care plan section)      Frequency    BID      PT Plan Current plan remains appropriate    Co-evaluation             End of Session Equipment Utilized During Treatment: Gait belt Activity Tolerance: Patient limited by pain Patient left: in bed;with call bell/phone within reach;with bed alarm set;with family/visitor present;with SCD's reapplied     Time: [id number] PT Time Calculation (min) (ACUTE ONLY): 14 min  Charges:  $Gait Training: 8-22 mins                    G Codes:  Danielle DessSarah Saryna Kneeland 11/11/2016, 3:58 PM

## 2016-11-11 NOTE — Progress Notes (Signed)
   Subjective: 2 Days Post-Op Procedure(s) (LRB): TOTAL HIP ARTHROPLASTY ANTERIOR APPROACH (Left) Patient reports pain as mild to moderate.   Patient is well, and has had no acute complaints or problems Denies any CP, SOB, ABD pain. We will continue therapy today.  Plan is to go to rehabilitation versus Home after hospital stay.  Objective: Vital signs in last 24 hours: Temp:  [97.6 F (36.4 C)-99.5 F (37.5 C)] 97.6 F (36.4 C) (11/16 0342) Pulse Rate:  [60-65] 64 (11/16 0342) Resp:  [17-18] 18 (11/16 0342) BP: (123-147)/(54-64) 123/64 (11/16 0342) SpO2:  [89 %-96 %] 94 % (11/16 0342)  Intake/Output from previous day: 11/15 0701 - 11/16 0700 In: 1865 [P.O.:720; I.V.:1145] Out: 1385 [Urine:1375; Drains:10] Intake/Output this shift: Total I/O In: 480 [P.O.:480] Out: 500 [Urine:500]   Recent Labs  11/11/16 0525  HGB 13.3   No results for input(s): WBC, RBC, HCT, PLT in the last 72 hours.  Recent Labs  11/09/16 1740 11/11/16 0525  NA  --  132*  K  --  3.9  CL  --  98*  CO2  --  28  BUN  --  12  CREATININE 0.98 0.99  GLUCOSE  --  152*  CALCIUM  --  8.5*   No results for input(s): LABPT, INR in the last 72 hours.  EXAM General - Patient is Alert, Appropriate and Oriented Extremity - Neurovascular intact Sensation intact distally Intact pulses distally Dorsiflexion/Plantar flexion intact No cellulitis present Compartment soft Dressing - dressing C/D/I, hemovac Removed with no complication Motor Function - intact, moving foot and toes well on exam. The patient ambulated 10 feet with physical therapy  Past Medical History:  Diagnosis Date  . Anginal pain (HCC)   . Arthritis   . BPH (benign prostatic hyperplasia)   . Coronary artery disease   . Diabetes mellitus without complication (HCC)    controlled by diet, no medication  . History of nuclear stress test 11/02/2011   bruce myoview; no ischemia  . Hypercholesteremia   . Hyperlipidemia   .  Hypertension   . Obesity     Assessment/Plan:   2 Days Post-Op Procedure(s) (LRB): TOTAL HIP ARTHROPLASTY ANTERIOR APPROACH (Left) Active Problems:   Primary osteoarthritis of left hip  Estimated body mass index is 39.13 kg/m as calculated from the following:   Height as of this encounter: 5\' 9"  (1.753 m).   Weight as of this encounter: 120.2 kg (265 lb). Advance diet Up with therapy  Needs BM CM to assist with discharge to skilled nursing versus home.  DVT Prophylaxis - Aspirin, Foot Pumps, TED hose and plavix Weight-Bearing as tolerated to left leg   Dedra Skeensodd Zanai Mallari, PA-C [facility] 11/11/2016, 6:29 AM

## 2016-11-11 NOTE — Progress Notes (Signed)
Social work Theatre manager met with patient alone at bedside. Patient was alert and oriented and was sitting up in the bed side chair. PT is still recommending SNF. However, patient is wanting to go home. Social work Theatre manager explained PT is still recommending for patient to go to SNF for short-term rehab and that [facility] has a bed available. Patient verbally agreed he would go there if he has to. Social work Theatre manager will continue to follow and assist as needed.   Danie Chandler, Social Work Intern  [phone]

## 2016-11-11 NOTE — Progress Notes (Signed)
Physical Therapy Treatment Patient Details Name: Darrell BoatmanRobert W Abbruzzese MRN: [id number] DOB: 11/30/1946 Today's Date: 11/11/2016    History of Present Illness Pt is a 70 y.o. male s/p L THA anterior approach secondary to OA 11/09/16.  PMH includes anginal pain, BPH, CAD, DM, and htn.    PT Comments    Pt able to progress ambulation distance with RW but still demonstrating difficulty advancing L LE.  Pt demonstrating decreased assist levels with bed mobility and transfers compared to yesterday.  Will continue to focus on L LE strengthing and improving pt's ability to advance L LE with ambulation (pt unable to clear L foot from floor to take a step; slides L foot on floor to advance).   Follow Up Recommendations  SNF     Equipment Recommendations   (pt has own RW)    Recommendations for Other Services       Precautions / Restrictions Precautions Precautions: Fall;Anterior Hip Precaution Booklet Issued: Yes (comment) Restrictions Weight Bearing Restrictions: Yes LLE Weight Bearing: Weight bearing as tolerated    Mobility  Bed Mobility Overal bed mobility: Needs Assistance Bed Mobility: Supine to Sit     Supine to sit: Min assist;HOB elevated     General bed mobility comments: assist for L LE; increased time to perform d/t pain  Transfers Overall transfer level: Needs assistance Equipment used: Rolling walker (2 wheeled) Transfers: Sit to/from Stand Sit to Stand: Min assist         General transfer comment: pt required vc's for hand and feet placement and walker use; increased effort to come to full stand  Ambulation/Gait Ambulation/Gait assistance: Min guard;Min assist Ambulation Distance (Feet): 50 Feet Assistive device: Rolling walker (2 wheeled)   Gait velocity: decreased   General Gait Details: antalgic; decreased stance time L LE; vc's required to increase UE support through RW to offweight L LE;  pt sliding L LE forward on floor (unable to clear floor);  practiced standing marching AAROM x10 reps L LE and then assisted intermittently with L LE hip flexion during gait to promote improved gait pattern; limited distance d/t L hip pain and increased difficulty advancing L LE (pt starting to bring L foot out laterally and pivot foot forward)   Stairs            Wheelchair Mobility    Modified Rankin (Stroke Patients Only)       Balance Overall balance assessment: Needs assistance Sitting-balance support: Bilateral upper extremity supported;Feet supported Sitting balance-Leahy Scale: Good Sitting balance - Comments: static sitting   Standing balance support: Single extremity supported (on RW) Standing balance-Leahy Scale: Poor Standing balance comment: min assist to steady when pt took R UE off walker (in static standing)                    Cognition Arousal/Alertness: Awake/alert Behavior During Therapy: WFL for tasks assessed/performed Overall Cognitive Status: Within Functional Limits for tasks assessed                      Exercises Total Joint Exercises Ankle Circles/Pumps: AROM;Strengthening;Both;10 reps;Supine Quad Sets: AROM;Strengthening;Both;10 reps;Supine Gluteal Sets: AROM;Strengthening;Both;10 reps;Supine Towel Squeeze: AROM;Strengthening;Both;10 reps;Supine Short Arc Quad: AROM;Strengthening;Left;10 reps;Supine Heel Slides: AAROM;Strengthening;Left;10 reps;Supine Hip ABduction/ADduction: AAROM;Strengthening;Left;10 reps;Supine Straight Leg Raises: AAROM;Strengthening;Left;10 reps;Supine  Pt required vc's for technique.    General Comments General comments (skin integrity, edema, etc.): Pt sitting up in bed upon PT arrival.  Pt agreeable to PT session.      Pertinent  Vitals/Pain Pain Assessment: 0-10 Pain Score: 5  Pain Location: L hip Pain Descriptors / Indicators: Sore;Tender Pain Intervention(s): Limited activity within patient's tolerance;Monitored during session;Premedicated before  session;Repositioned;Ice applied  Pt's O2 initially 90% on RA beginning of session but post ambulation appeared to fluctuate between 86-90% but with vc's for purse lipped breathing pt's O2 increased to 96% (nursing notified).    Home Living                      Prior Function            PT Goals (current goals can now be found in the care plan section) Acute Rehab PT Goals Patient Stated Goal: to be able to walk further PT Goal Formulation: With patient Time For Goal Achievement: 11/24/16 Potential to Achieve Goals: Good Progress towards PT goals: Progressing toward goals    Frequency    BID      PT Plan Current plan remains appropriate    Co-evaluation             End of Session Equipment Utilized During Treatment: Gait belt Activity Tolerance: Patient limited by pain Patient left: in chair;with call bell/phone within reach;with chair alarm set;with SCD's reapplied (B heels elevated via pillow; ice pack in place)     Time: [id number] PT Time Calculation (min) (ACUTE ONLY): 40 min  Charges:  $Gait Training: 8-22 mins $Therapeutic Exercise: 8-22 mins $Therapeutic Activity: 8-22 mins                    G CodesHendricks Limes:      Krayton Wortley 11/11/2016, 10:54 AM Hendricks LimesEmily Zaira Iacovelli, PT [phone]

## 2016-11-11 NOTE — Discharge Instructions (Signed)
ANTERIOR APPROACH TOTAL HIP REPLACEMENT POSTOPERATIVE DIRECTIONS ° ° °Hip Rehabilitation, Guidelines Following Surgery  °The results of a hip operation are greatly improved after range of motion and muscle strengthening exercises. Follow all safety measures which are given to protect your hip. If any of these exercises cause increased pain or swelling in your joint, decrease the amount until you are comfortable again. Then slowly increase the exercises. Call your caregiver if you have problems or questions.  ° °HOME CARE INSTRUCTIONS  °Remove items at home which could result in a fall. This includes throw rugs or furniture in walking pathways.  °· ICE to the affected hip every three hours for 30 minutes at a time and then as needed for pain and swelling.  Continue to use ice on the hip for pain and swelling from surgery. You may notice swelling that will progress down to the foot and ankle.  This is normal after surgery.  Elevate the leg when you are not up walking on it.   °· Continue to use the breathing machine which will help keep your temperature down.  It is common for your temperature to cycle up and down following surgery, especially at night when you are not up moving around and exerting yourself.  The breathing machine keeps your lungs expanded and your temperature down. °· Do not place pillow under knee, focus on keeping the knee straight while resting ° °DIET °You may resume your previous home diet once your are discharged from the hospital. ° °DRESSING / WOUND CARE / SHOWERING °Keep the surgical dressing until follow up.  The dressing is water proof, so you can shower without any extra covering.  IF THE DRESSING FALLS OFF or the wound gets wet inside, change the dressing with sterile gauze.  Please use good hand washing techniques before changing the dressing.  Do not use any lotions or creams on the incision until instructed by your surgeon.   °Keep your dressing dry with showering.  You can keep it  covered and pat dry. °Change the surgical dressing daily and reapply a dry dressing each time. ° °ACTIVITY °Walk with your walker as instructed. °Use walker as long as suggested by your caregivers. °Avoid periods of inactivity such as sitting longer than an hour when not asleep. This helps prevent blood clots.  °You may resume a sexual relationship in one month or when given the OK by your doctor.  °You may return to work once you are cleared by your doctor.  °Do not drive a car for 6 weeks or until released by you surgeon.  °Do not drive while taking narcotics. ° °WEIGHT BEARING °Weight bearing as tolerated with assist device (walker, cane, etc) as directed, use it as long as suggested by your surgeon or therapist, typically at least 4-6 weeks. ° °POSTOPERATIVE CONSTIPATION PROTOCOL °Constipation - defined medically as fewer than three stools per week and severe constipation as less than one stool per week. ° °One of the most common issues patients have following surgery is constipation.  Even if you have a regular bowel pattern at home, your normal regimen is likely to be disrupted due to multiple reasons following surgery.  Combination of anesthesia, postoperative narcotics, change in appetite and fluid intake all can affect your bowels.  In order to avoid complications following surgery, here are some recommendations in order to help you during your recovery period. ° °Colace (docusate) - Pick up an over-the-counter form of Colace or another stool softener and take twice   a day as long as you are requiring postoperative pain medications.  Take with a full glass of water daily.  If you experience loose stools or diarrhea, hold the colace until you stool forms back up.  If your symptoms do not get better within 1 week or if they get worse, check with your doctor. ° °Dulcolax (bisacodyl) - Pick up over-the-counter and take as directed by the product packaging as needed to assist with the movement of your bowels.   Take with a full glass of water.  Use this product as needed if not relieved by Colace only.  ° °MiraLax (polyethylene glycol) - Pick up over-the-counter to have on hand.  MiraLax is a solution that will increase the amount of water in your bowels to assist with bowel movements.  Take as directed and can mix with a glass of water, juice, soda, coffee, or tea.  Take if you go more than two days without a movement. °Do not use MiraLax more than once per day. Call your doctor if you are still constipated or irregular after using this medication for 7 days in a row. ° °If you continue to have problems with postoperative constipation, please contact the office for further assistance and recommendations.  If you experience "the worst abdominal pain ever" or develop nausea or vomiting, please contact the office immediatly for further recommendations for treatment. ° °ITCHING ° If you experience itching with your medications, try taking only a single pain pill, or even half a pain pill at a time.  You can also use Benadryl over the counter for itching or also to help with sleep.  ° °TED HOSE STOCKINGS °Wear the elastic stockings on both legs for three weeks following surgery during the day but you may remove then at night for sleeping. ° °MEDICATIONS °See your medication summary on the “After Visit Summary” that the nursing staff will review with you prior to discharge.  You may have some home medications which will be placed on hold until you complete the course of blood thinner medication.  It is important for you to complete the blood thinner medication as prescribed by your surgeon.  Continue your approved medications as instructed at time of discharge. ° °PRECAUTIONS °If you experience chest pain or shortness of breath - call 911 immediately for transfer to the hospital emergency department.  °If you develop a fever greater that 101 F, purulent drainage from wound, increased redness or drainage from wound, foul odor  from the wound/dressing, or calf pain - CONTACT YOUR SURGEON.   °                                                °FOLLOW-UP APPOINTMENTS °Make sure you keep all of your appointments after your operation with your surgeon and caregivers. You should call the office at the above phone number and make an appointment for approximately two weeks after the date of your surgery or on the date instructed by your surgeon outlined in the "After Visit Summary". ° °RANGE OF MOTION AND STRENGTHENING EXERCISES  °These exercises are designed to help you keep full movement of your hip joint. Follow your caregiver's or physical therapist's instructions. Perform all exercises about fifteen times, three times per day or as directed. Exercise both hips, even if you have had only one joint replacement. These exercises can be   done on a training (exercise) mat, on the floor, on a table or on a bed. Use whatever works the best and is most comfortable for you. Use music or television while you are exercising so that the exercises are a pleasant break in your day. This will make your life better with the exercises acting as a break in routine you can look forward to.  °Lying on your back, slowly slide your foot toward your buttocks, raising your knee up off the floor. Then slowly slide your foot back down until your leg is straight again.  °Lying on your back spread your legs as far apart as you can without causing discomfort.  °Lying on your side, raise your upper leg and foot straight up from the floor as far as is comfortable. Slowly lower the leg and repeat.  °Lying on your back, tighten up the muscle in the front of your thigh (quadriceps muscles). You can do this by keeping your leg straight and trying to raise your heel off the floor. This helps strengthen the largest muscle supporting your knee.  °Lying on your back, tighten up the muscles of your buttocks both with the legs straight and with the knee bent at a comfortable angle while  keeping your heel on the floor.  ° °IF YOU ARE TRANSFERRED TO A SKILLED REHAB FACILITY °If the patient is transferred to a skilled rehab facility following release from the hospital, a list of the current medications will be sent to the facility for the patient to continue.  When discharged from the skilled rehab facility, please have the facility set up the patient's Home Health Physical Therapy prior to being released. Also, the skilled facility will be responsible for providing the patient with their medications at time of release from the facility to include their pain medication, the muscle relaxants, and their blood thinner medication. If the patient is still at the rehab facility at time of the two week follow up appointment, the skilled rehab facility will also need to assist the patient in arranging follow up appointment in our office and any transportation needs. ° °MAKE SURE YOU:  °Understand these instructions.  °Get help right away if you are not doing well or get worse.  ° ° °Pick up stool softner and laxative for home use following surgery while on pain medications. °Do not submerge incision under water. °Please use good hand washing techniques while changing dressing each day. °May shower starting three days after surgery. °Please use a clean towel to pat the incision dry following showers. °Continue to use ice for pain and swelling after surgery. °Do not use any lotions or creams on the incision until instructed by your surgeon. ° °

## 2016-11-12 ENCOUNTER — Encounter
Admission: RE | Admit: 2016-11-12 | Discharge: 2016-11-12 | Disposition: A | Discharge status: Home / Self Care | Payer: Medicare Other | Source: Ambulatory Visit | Attending: Internal Medicine | Admitting: Internal Medicine

## 2016-11-12 NOTE — Progress Notes (Addendum)
LCSW met with patient with PT as PT session was about to begin. Patient still undecided about SNF, however after session it was recommended that he go on to [facility] SNF as he did not progress well. Patient agreeable and with [facility] as bed accepted. Patient requesting LCSW wait for wife to arrive before giving more information about SNF in which LCSW will respect. Wife to be here at [place] notified facility who was in agreement with plan. Will speak with wife when she arrives. Wife in agreement with plan and hopeful for short visit at facility.  Plan is for wife to take patient to SNF today  After 12:00. All dc paperwork has been completed.  Lane Hacker, MSW Clinical Social Work: [employer] Coverage for :  [phone]

## 2016-11-12 NOTE — Progress Notes (Signed)
Physical Therapy Treatment Patient Details Name: Darrell BoatmanRobert W Jennings MRN: [id number] DOB: 03/23/1946 Today's Date: 11/12/2016    History of Present Illness Pt is a 70 y.o. male s/p L THA anterior approach secondary to OA 11/09/16.  PMH includes anginal pain, BPH, CAD, DM, and htn.    PT Comments    Pt able to progress ambulation distance to 60 feet with RW but pt unable to clear L foot from floor to advance L LE (pt sliding L foot on floor or required assist of therapist to advance to clear L foot from floor).  Continue to recommend pt discharge to [facility] (pt agreeable; SW notified).  Will continue to progress pt with LE strengthening, balance, ambulation distance, and increasing independence with functional mobility per pt tolerance.   Follow Up Recommendations  SNF     Equipment Recommendations   (pt has own RW)    Recommendations for Other Services       Precautions / Restrictions Precautions Precautions: Fall;Anterior Hip Precaution Booklet Issued: Yes (comment) Restrictions Weight Bearing Restrictions: Yes LLE Weight Bearing: Weight bearing as tolerated    Mobility  Bed Mobility Overal bed mobility: Needs Assistance Bed Mobility: Supine to Sit     Supine to sit: Min guard     General bed mobility comments: CGA for L LE; unable to get up without heavy use of R side rail; increased time and effort to perform  Transfers Overall transfer level: Needs assistance Equipment used: Rolling walker (2 wheeled) Transfers: Sit to/from Stand Sit to Stand: Min guard;Min assist         General transfer comment: increased effort to come to full stand; mildly unsteady upon coming to upright  Ambulation/Gait Ambulation/Gait assistance: Min guard;Min assist Ambulation Distance (Feet): 60 Feet Assistive device: Rolling walker (2 wheeled) Gait Pattern/deviations: Step-to pattern Gait velocity: decreased   General Gait Details: min assist needed to advance LLE intermittently and  clear L foot from floor (pt otherwise able to slide L foot forward on floor but never able to clear floor with foot without assist of therapist), occasionally unsteady   Stairs            Wheelchair Mobility    Modified Rankin (Stroke Patients Only)       Balance Overall balance assessment: Needs assistance Sitting-balance support: Feet supported;Bilateral upper extremity supported Sitting balance-Leahy Scale: Good Sitting balance - Comments: static sitting   Standing balance support: Bilateral upper extremity supported Standing balance-Leahy Scale: Poor Standing balance comment: posterior lean initially upon standing requiring min assist to steady; intermittent unsteadiness with ambulation                    Cognition Arousal/Alertness: Awake/alert Behavior During Therapy: WFL for tasks assessed/performed Overall Cognitive Status: Within Functional Limits for tasks assessed                      Exercises      General Comments General comments (skin integrity, edema, etc.): Pt resting in bed upon PT arrival.  Pt agreeable to PT session.      Pertinent Vitals/Pain Pain Assessment: 0-10 Pain Score: 5  (0/10 at rest beginning and end of session; 5/10 with activity) Pain Location: L hip Pain Descriptors / Indicators: Sore;Tender;Operative site guarding;Grimacing Pain Intervention(s): Limited activity within patient's tolerance;Monitored during session;Repositioned;Ice applied (Pt declined pain meds prior to session and post session)    Home Living  Prior Function            PT Goals (current goals can now be found in the care plan section) Acute Rehab PT Goals Patient Stated Goal: to be able to walk further PT Goal Formulation: With patient Time For Goal Achievement: 11/24/16 Potential to Achieve Goals: Good Progress towards PT goals: Progressing toward goals    Frequency    BID      PT Plan Current plan  remains appropriate    Co-evaluation             End of Session Equipment Utilized During Treatment: Gait belt Activity Tolerance: Patient limited by pain Patient left: in chair;with call bell/phone within reach;with chair alarm set;with SCD's reapplied (B heels elevated via pillow; NT notified pt requiring new ice for ice bag)     Time: [id number] PT Time Calculation (min) (ACUTE ONLY): 23 min  Charges:  $Gait Training: 8-22 mins $Therapeutic Activity: 8-22 mins                    G CodesHendricks Limes:      Darrell Jennings 11/12/2016, 9:55 AM Hendricks LimesEmily Cameshia Cressman, PT [phone]

## 2016-11-12 NOTE — Progress Notes (Signed)
[place] [facility] Answered all questions. Spouse will be transporting via personal vehicle. RX will be given upon discharge.

## 2016-11-12 NOTE — Care Management Important Message (Signed)
Important Message  Patient Details  Name: Darrell BoatmanRobert W Jennings MRN: [id number] Date of Birth: 06/12/1946   Medicare Important Message Given:  Yes    Marily MemosLisa M Kadian Barcellos, RN 11/12/2016, 9:09 AM

## 2016-11-12 NOTE — Discharge Summary (Signed)
Physician Discharge Summary  Subjective: 3 Days Post-Op Procedure(s) (LRB): TOTAL HIP ARTHROPLASTY ANTERIOR APPROACH (Left) Patient reports pain as mild.   Patient seen in rounds with Dr. [name]. Patient is well, and has had no acute complaints or problems Patient is ready to go To rehabilitation for physical therapy  Physician Discharge Summary  Patient ID: Darrell BoatmanRobert W Jennings MRN: [id number] DOB/AGE: 69/06/1946 69 y.o.  Admit date: 11/09/2016 Discharge date: 11/12/2016  Admission Diagnoses:  Discharge Diagnoses:  Active Problems:   Primary osteoarthritis of left hip   Discharged Condition: fair  Hospital Course: The patient is postop day 3 from a left anterior approach total hip replacement. He has done well since surgery. He ambulated 50 feet with physical therapy and is progressing with transfers. The patient had stable labs and blood pressure. He had a drain removed on postop day 2 with no complication. The patient still has not had a bowel movement.  Treatments: surgery:  TOTAL HIP ARTHROPLASTY ANTERIOR APPROACH (Left)  SURGEON: Leitha SchullerMichael J Menz, MD  ASSISTANTS: None  ANESTHESIA:   spinal  EBL:  Total I/O In: 1000 [I.V.:1000] Out: 400 [Urine:200; Blood:200]  BLOOD ADMINISTERED:none  DRAINS: (2) Hemovact drain(s) in the Subcutaneous layer with  Suction Open   LOCAL MEDICATIONS USED:  MARCAINE    and OTHER TXA injected into the joint after capsule closure  SPECIMEN:  Source of Specimen:  Left femoral head  DISPOSITION OF SPECIMEN:  PATHOLOGY  COUNTS:  YES  TOURNIQUET:  * No tourniquets in log *  IMPLANTS: Medacta Amis 1 standard collared stem with 58 mm Mpact cup DM with liner and S 28 mm head  Discharge Exam: Blood pressure (!) 147/60, pulse 78, temperature 98.6 F (37 C), temperature source Oral, resp. rate 20, height 5\' 9"  (1.753 m), weight 120.2 kg (265 lb), SpO2 95 %.   Disposition: 06-Home-Health Care Svc     Medication List    TAKE  these medications   arginine 500 MG tablet Take 1,000 mg by mouth daily with lunch.   aspirin EC 81 MG tablet Take 81 mg by mouth daily.   B-complex with vitamin C tablet Take 1 tablet by mouth daily with lunch.   clopidogrel 75 MG tablet Commonly known as:  PLAVIX Take 1 tablet (75 mg total) by mouth daily.   Co Q 10 100 MG Caps Take 100 mg by mouth daily with lunch.   DHEA 25 MG Caps Take 25 mg by mouth daily with lunch.   fish oil-omega-3 fatty acids 1000 MG capsule Take 1 g by mouth at bedtime.   Garlic 1000 MG Caps Take 2,000 mg by mouth daily with lunch.   ibuprofen 200 MG tablet Commonly known as:  ADVIL,MOTRIN Take 400-600 mg by mouth every 8 (eight) hours as needed (for pain.).   lisinopril 5 MG tablet Commonly known as:  PRINIVIL,ZESTRIL Take 1 tablet (5 mg total) by mouth daily.   metoprolol succinate 25 MG 24 hr tablet Commonly known as:  TOPROL-XL Take 1 tablet (25 mg total) by mouth daily.   multivitamin with minerals Tabs tablet Take 1 tablet by mouth daily with lunch.   oxyCODONE 5 MG immediate release tablet Commonly known as:  Oxy IR/ROXICODONE Take 1-2 tablets (5-10 mg total) by mouth every 4 (four) hours as needed for breakthrough pain.   saw palmetto 160 MG capsule Take 160 mg by mouth daily with lunch.   Selenium 100 MCG Tabs Take 1 tablet by mouth daily.   simvastatin 20 MG  tablet Commonly known as:  ZOCOR Take 1 tablet (20 mg total) by mouth at bedtime. What changed:  when to take this   traMADol 50 MG tablet Commonly known as:  ULTRAM Take 2 tablets (100 mg total) by mouth every 6 (six) hours as needed. What changed:  when to take this  reasons to take this            Durable Medical Equipment        Start     Ordered   11/09/16 1308  DME Walker rolling  Once     11/09/16 1307   11/09/16 1308  DME 3 n 1  Once     11/09/16 1307   11/09/16 1308  DME Bedside commode  Once     11/09/16 1307      Contact  information for follow-up providers    MENZ,MICHAEL, MD Follow up in 2 week(s).   Specialty:  Orthopedic Surgery Why:  For staple removal Contact information: [address] [facility] [place] [address] [phone]            Contact information for after-discharge care    Destination    [facility] .   Specialty:  Skilled Nursing Facility Contact information: [address] [place] [address] [phone]                  Signed: [name], Ac Colan 11/12/2016, 6:22 AM   Objective: Vital signs in last 24 hours: Temp:  [98 F (36.7 C)-98.9 F (37.2 C)] 98.6 F (37 C) (11/17 0408) Pulse Rate:  [65-78] 78 (11/17 0408) Resp:  [17-20] 20 (11/17 0408) BP: (129-155)/(55-65) 147/60 (11/17 0408) SpO2:  [91 %-95 %] 95 % (11/17 0408)  Intake/Output from previous day:  Intake/Output Summary (Last 24 hours) at 11/12/16 0622 Last data filed at 11/11/16 2010  Gross per 24 hour  Intake              240 ml  Output              200 ml  Net               40 ml    Intake/Output this shift: Total I/O In: 240 [P.O.:240] Out: 200 [Urine:200]  Labs:  Recent Labs  11/11/16 0525  HGB 13.3   No results for input(s): WBC, RBC, HCT, PLT in the last 72 hours.  Recent Labs  11/09/16 1740 11/11/16 0525  NA  --  132*  K  --  3.9  CL  --  98*  CO2  --  28  BUN  --  12  CREATININE 0.98 0.99  GLUCOSE  --  152*  CALCIUM  --  8.5*   No results for input(s): LABPT, INR in the last 72 hours.  EXAM: General - Patient is Alert and Oriented Extremity - Sensation intact distally Dorsiflexion/Plantar flexion intact No cellulitis present Compartment soft Incision - clean, dry, no drainage Motor Function -  the patient ambulated 50 feet. He is able to plantarflex and dorsiflex his feet.  Assessment/Plan: 3 Days Post-Op Procedure(s) (LRB): TOTAL HIP ARTHROPLASTY ANTERIOR APPROACH (Left) Procedure(s) (LRB): TOTAL  HIP ARTHROPLASTY ANTERIOR APPROACH (Left) Past Medical History:  Diagnosis Date  . Anginal pain (HCC)   . Arthritis   . BPH (benign prostatic hyperplasia)   . Coronary artery disease   . Diabetes mellitus without complication (HCC)    controlled by diet, no medication  . History of  nuclear stress test 11/02/2011   bruce myoview; no ischemia  . Hypercholesteremia   . Hyperlipidemia   . Hypertension   . Obesity    Active Problems:   Primary osteoarthritis of left hip  Estimated body mass index is 39.13 kg/m as calculated from the following:   Height as of this encounter: 5\' 9"  (1.753 m).   Weight as of this encounter: 120.2 kg (265 lb). Up with therapy Discharge to SNF Diet - Regular diet Follow up - in 2 weeks Activity - WBAT Disposition - Rehab Condition Upon Discharge - Stable DVT Prophylaxis - TED hose and Plavix  Dedra Skeensodd Ayianna Darnold, PA-C Orthopaedic Surgery 11/12/2016, 6:22 AM

## 2016-11-12 NOTE — Plan of Care (Signed)
Problem: Bowel/Gastric: Goal: Will not experience complications related to bowel motility Outcome: Progressing Milk of magnesia administered

## 2016-11-22 ENCOUNTER — Non-Acute Institutional Stay (SKILLED_NURSING_FACILITY): Payer: Medicare Other | Admitting: Gerontology

## 2016-11-22 DIAGNOSIS — Z471 Aftercare following joint replacement surgery: Secondary | ICD-10-CM | POA: Diagnosis not present

## 2016-11-22 DIAGNOSIS — Z96642 Presence of left artificial hip joint: Secondary | ICD-10-CM | POA: Diagnosis not present

## 2016-11-22 DIAGNOSIS — G8918 Other acute postprocedural pain: Secondary | ICD-10-CM | POA: Diagnosis not present

## 2016-11-25 DIAGNOSIS — Z471 Aftercare following joint replacement surgery: Secondary | ICD-10-CM | POA: Insufficient documentation

## 2016-11-25 NOTE — Progress Notes (Signed)
Location:      Place of Service:  SNF (31) Provider:  Lorenso Quarry, NP-C  Marisue Ivan, MD  Patient Care Team: Marisue Ivan, MD as PCP - General (Family Medicine)  Extended Emergency Contact Information Primary Emergency Contact: Davis,[name] Address: [address]          [place], [place] [address] [place] of [place] Home Phone: [phone] Mobile Phone: [phone] Relation: Significant other  Code Status:  full Goals of care: Advanced Directive information Advanced Directives 11/09/2016  Does Patient Have a Medical Advance Directive? No  Would patient like information on creating a medical advance directive? No - patient declined information     Chief Complaint  Patient presents with  . Follow-up    HPI:  Pt is a 70 y.o. male seen today for an acute visit for assessment of pain, post operative following left total hip replacement. Pt has been working well with PT. He is excited to be able to use a cane to go home with. He reports his pain as minimal. He does feel that the oxycodone is too strong, but the tramadol takes a while to work. We discussed using scheduled tylenol for a few days with tramadol prn- especially prior to therapy. Pt was agreeable to this plan. No c/o n/v/d/f/c/cp/sob/ha/abd pain/dizziness. Pt reports having regular BMs. Appetite good. Overall, pt feels he is doing well. VSS. No other complaints.     Past Medical History:  Diagnosis Date  . Anginal pain (HCC)   . Arthritis   . BPH (benign prostatic hyperplasia)   . Coronary artery disease   . Diabetes mellitus without complication (HCC)    controlled by diet, no medication  . History of nuclear stress test 11/02/2011   bruce myoview; no ischemia  . Hypercholesteremia   . Hyperlipidemia   . Hypertension   . Obesity    Past Surgical History:  Procedure Laterality Date  . ANKLE FRACTURE SURGERY Left    ANKLE SYNDESMOTIC SCREW REMOVAL  . COLONOSCOPY WITH PROPOFOL N/A 06/16/2015   Procedure: COLONOSCOPY WITH PROPOFOL;  Surgeon: Christena Deem, MD;  Location: [facility] ENDOSCOPY;  Service: Endoscopy;  Laterality: N/A;  . CORONARY ANGIOPLASTY WITH STENT PLACEMENT  06/14/2011   2.5x74mm Resolute stent of LAD (total occlusion to 0%)- Dr. Erlene Quan)  . HEMORRHOID SURGERY    . LEG SURGERY  2010   broke leg  . TOTAL HIP ARTHROPLASTY Left 11/09/2016   Procedure: TOTAL HIP ARTHROPLASTY ANTERIOR APPROACH;  Surgeon: Kennedy Bucker, MD;  Location: [facility] ORS;  Service: Orthopedics;  Laterality: Left;    No Known Allergies    Medication List       Accurate as of 11/22/16 11:59 PM. Always use your most recent med list.          arginine 500 MG tablet Take 1,000 mg by mouth daily with lunch.   aspirin EC 81 MG tablet Take 81 mg by mouth daily.   B-complex with vitamin C tablet Take 1 tablet by mouth daily with lunch.   clopidogrel 75 MG tablet Commonly known as:  PLAVIX Take 1 tablet (75 mg total) by mouth daily.   Co Q 10 100 MG Caps Take 100 mg by mouth daily with lunch.   DHEA 25 MG Caps Take 25 mg by mouth daily with lunch.   fish oil-omega-3 fatty acids 1000 MG capsule Take 1 g by mouth at bedtime.   Garlic 1000 MG Caps Take 2,000 mg by mouth daily with lunch.   ibuprofen  200 MG tablet Commonly known as:  ADVIL,MOTRIN Take 400-600 mg by mouth every 8 (eight) hours as needed (for pain.).   lisinopril 5 MG tablet Commonly known as:  PRINIVIL,ZESTRIL Take 1 tablet (5 mg total) by mouth daily.   metoprolol succinate 25 MG 24 hr tablet Commonly known as:  TOPROL-XL Take 1 tablet (25 mg total) by mouth daily.   multivitamin with minerals Tabs tablet Take 1 tablet by mouth daily with lunch.   oxyCODONE 5 MG immediate release tablet Commonly known as:  Oxy IR/ROXICODONE Take 1-2 tablets (5-10 mg total) by mouth every 4 (four) hours as needed for breakthrough pain.   saw palmetto 160 MG capsule Take 160 mg by mouth daily with lunch.   Selenium 100 MCG  Tabs Take 1 tablet by mouth daily.   simvastatin 20 MG tablet Commonly known as:  ZOCOR Take 1 tablet (20 mg total) by mouth at bedtime.   traMADol 50 MG tablet Commonly known as:  ULTRAM Take 2 tablets (100 mg total) by mouth every 6 (six) hours as needed.       Review of Systems  Constitutional: Negative for activity change, appetite change, chills, diaphoresis and fever.  HENT: Negative for congestion, sneezing, sore throat, trouble swallowing and voice change.   Eyes: Negative.   Respiratory: Negative for apnea, cough, choking, chest tightness, shortness of breath and wheezing.   Cardiovascular: Negative for chest pain, palpitations and leg swelling.  Gastrointestinal: Negative for abdominal distention, abdominal pain, constipation, diarrhea and nausea.  Genitourinary: Negative for difficulty urinating, dysuria, frequency and urgency.  Musculoskeletal: Positive for arthralgias (typical arthritis). Negative for back pain, gait problem and myalgias.  Skin: Positive for wound. Negative for color change, pallor and rash.  Neurological: Negative for dizziness, tremors, syncope, speech difficulty, weakness, numbness and headaches.  Hematological: Negative.   Psychiatric/Behavioral: Negative.   All other systems reviewed and are negative.    There is no immunization history on file for this patient. Pertinent  Health Maintenance Due  Topic Date Due  . PNA vac Low Risk Adult (1 of 2 - PCV13) 11/02/2012  . INFLUENZA VACCINE  07/27/2016  . COLONOSCOPY  06/15/2025   No flowsheet data found. Functional Status Survey:    Vitals:   11/22/16 0700  BP: (!) 134/50  Pulse: 65  Temp: 98 F (36.7 C)  SpO2: 93%  Weight: 252 lb 4.8 oz (114.4 kg)   Body mass index is 37.26 kg/m. Physical Exam  Constitutional: He is oriented to person, place, and time. Vital signs are normal. He appears well-developed and well-nourished. He is active and cooperative. He does not appear ill. No  distress.  HENT:  Head: Normocephalic and atraumatic.  Mouth/Throat: Uvula is midline, oropharynx is clear and moist and mucous membranes are normal. Mucous membranes are not pale, not dry and not cyanotic.  Eyes: Conjunctivae, EOM and lids are normal. Pupils are equal, round, and reactive to light.  Neck: Trachea normal, normal range of motion and full passive range of motion without pain. Neck supple. No JVD present. No tracheal deviation, no edema and no erythema present. No thyromegaly present.  Cardiovascular: Normal rate, regular rhythm, normal heart sounds, intact distal pulses and normal pulses.  Exam reveals no gallop, no distant heart sounds and no friction rub.   No murmur heard. Pulmonary/Chest: Effort normal and breath sounds normal. No accessory muscle usage. No respiratory distress. He has no wheezes. He has no rales. He exhibits no tenderness.  Abdominal: Normal appearance and  bowel sounds are normal. He exhibits no distension and no ascites. There is no tenderness.  Musculoskeletal: He exhibits no edema or tenderness.       Left hip: He exhibits decreased range of motion and decreased strength.  Expected osteoarthritis, stiffness  Neurological: He is alert and oriented to person, place, and time. He has normal strength.  Skin: Skin is warm and dry. Laceration (left hip incision) noted. No rash noted. He is not diaphoretic. No cyanosis or erythema. No pallor. Nails show no clubbing.  Psychiatric: He has a normal mood and affect. His speech is normal and behavior is normal. Judgment and thought content normal. Cognition and memory are normal.  Nursing note and vitals reviewed.   Labs reviewed:  Recent Labs  11/09/16 1740 11/11/16 0525  NA  --  132*  K  --  3.9  CL  --  98*  CO2  --  28  GLUCOSE  --  152*  BUN  --  12  CREATININE 0.98 0.99  CALCIUM  --  8.5*   No results for input(s): AST, ALT, ALKPHOS, BILITOT, PROT, ALBUMIN in the last 8760 hours.  Recent Labs   11/11/16 0525  HGB 13.3   No results found for: TSH No results found for: HGBA1C Lab Results  Component Value Date   CHOL 131 10/30/2013   HDL 33 (L) 10/30/2013   LDLCALC 74 10/30/2013   TRIG 118 10/30/2013   CHOLHDL 4.0 10/30/2013    Significant Diagnostic Results in last 30 days:  Dg Hip Operative Unilat W Or W/o Pelvis Left  Result Date: 11/09/2016 CLINICAL DATA:  Intraoperative imaging in patient for left hip replacement. EXAM: OPERATIVE LEFT HIP (WITH PELVIS IF PERFORMED) 2 VIEWS TECHNIQUE: Fluoroscopic spot image(s) were submitted for interpretation post-operatively. COMPARISON:  MRI left hip 04/23/2016. FINDINGS: Fluoroscopic spot imaging demonstrates a left total hip arthroplasty in place. The distal femoral stem is not included on the exam. No acute abnormality is identified. IMPRESSION: Intraoperative imaging for left total hip arthroplasty. Electronically Signed   By: Drusilla Kannerhomas  Dalessio M.D.   On: 11/09/2016 12:11   Dg Hip Unilat W Or W/o Pelvis 2-3 Views Left  Result Date: 11/09/2016 CLINICAL DATA:  Status post left total hip joint replacement. EXAM: DG HIP (WITH OR WITHOUT PELVIS) 2-3V LEFT COMPARISON:  Fluoro spot images of today's date FINDINGS: The patient has undergone left hip joint prosthesis placement. Radiographic positioning of the prosthetic components is good. The native bone exhibits no acute abnormality. Surgical drain lines and skin staples are present. IMPRESSION: AP and lateral portable radiographs postoperatively reveal no immediate postprocedure complication. Electronically Signed   By: David  SwazilandJordan M.D.   On: 11/09/2016 12:44    Assessment/Plan 1. Pain, acute postoperative 2. Aftercare following left hip joint replacement surgery  Tramadol 100 mg po Q 6 hours prn pain  DC Oxycodone d/t not needed  Continue complementary therapies including:  PT/OT  Restorative Nursing  Ice pack to site QID and prn  Diversional activities  Repositioning Q2  hours and prn  Scheduled Tylenol 650 mg po QID  Family/ staff Communication:   Total Time:  Documentation:  Face to Face:  Family/Phone:   Labs/tests ordered:    Medication list reviewed and assessed for continued appropriateness.  Brynda RimShannon H. Jaquel Glassburn, NP-C Geriatrics [facility] [phone] [address] [place], [place] [id number] Cell Phone (Mon-Fri 8am-5pm):  [phone] On Call:  [phone] & follow prompts after 5pm & weekends Office Phone:  [phone] Office Fax:  [phone]

## 2016-11-26 ENCOUNTER — Encounter: Admission: RE | Admit: 2016-11-26 | Payer: Medicare Other | Source: Ambulatory Visit | Admitting: Internal Medicine

## 2017-01-15 ENCOUNTER — Other Ambulatory Visit: Payer: Self-pay | Admitting: Cardiovascular Disease

## 2017-01-17 NOTE — Telephone Encounter (Signed)
Rx(s) sent to pharmacy electronically.  

## 2017-05-05 IMAGING — DX DG HIP (WITH OR WITHOUT PELVIS) 2-3V*L*
2 series · 2 of 2 positions shown · non-contrast
Comparison: Fluoro spot images of today's date

CLINICAL DATA: Status post left total hip joint replacement.

EXAM:
DG HIP (WITH OR WITHOUT PELVIS) 2-3V LEFT

[hip ap]
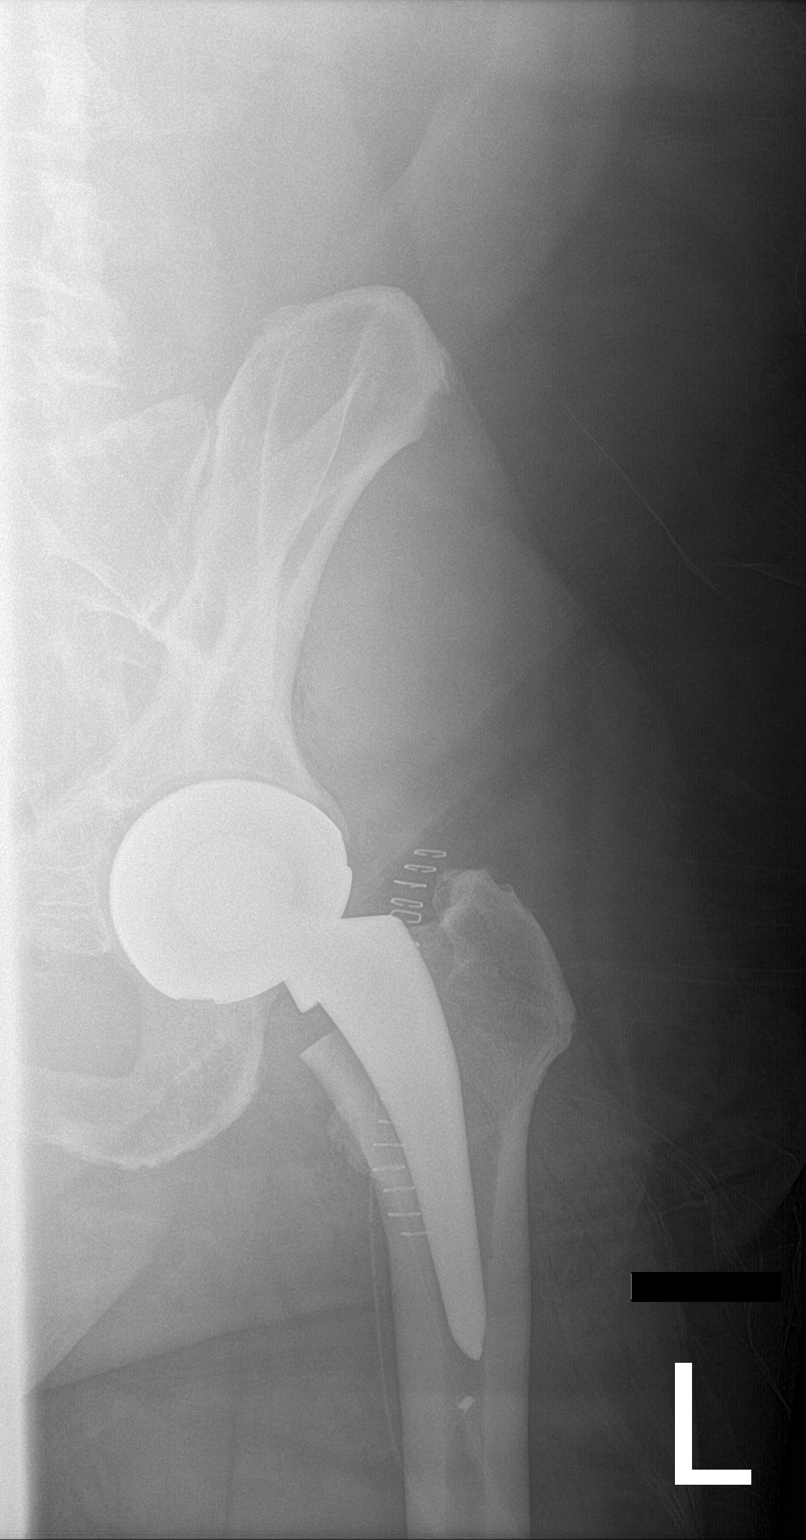

[hip lat]
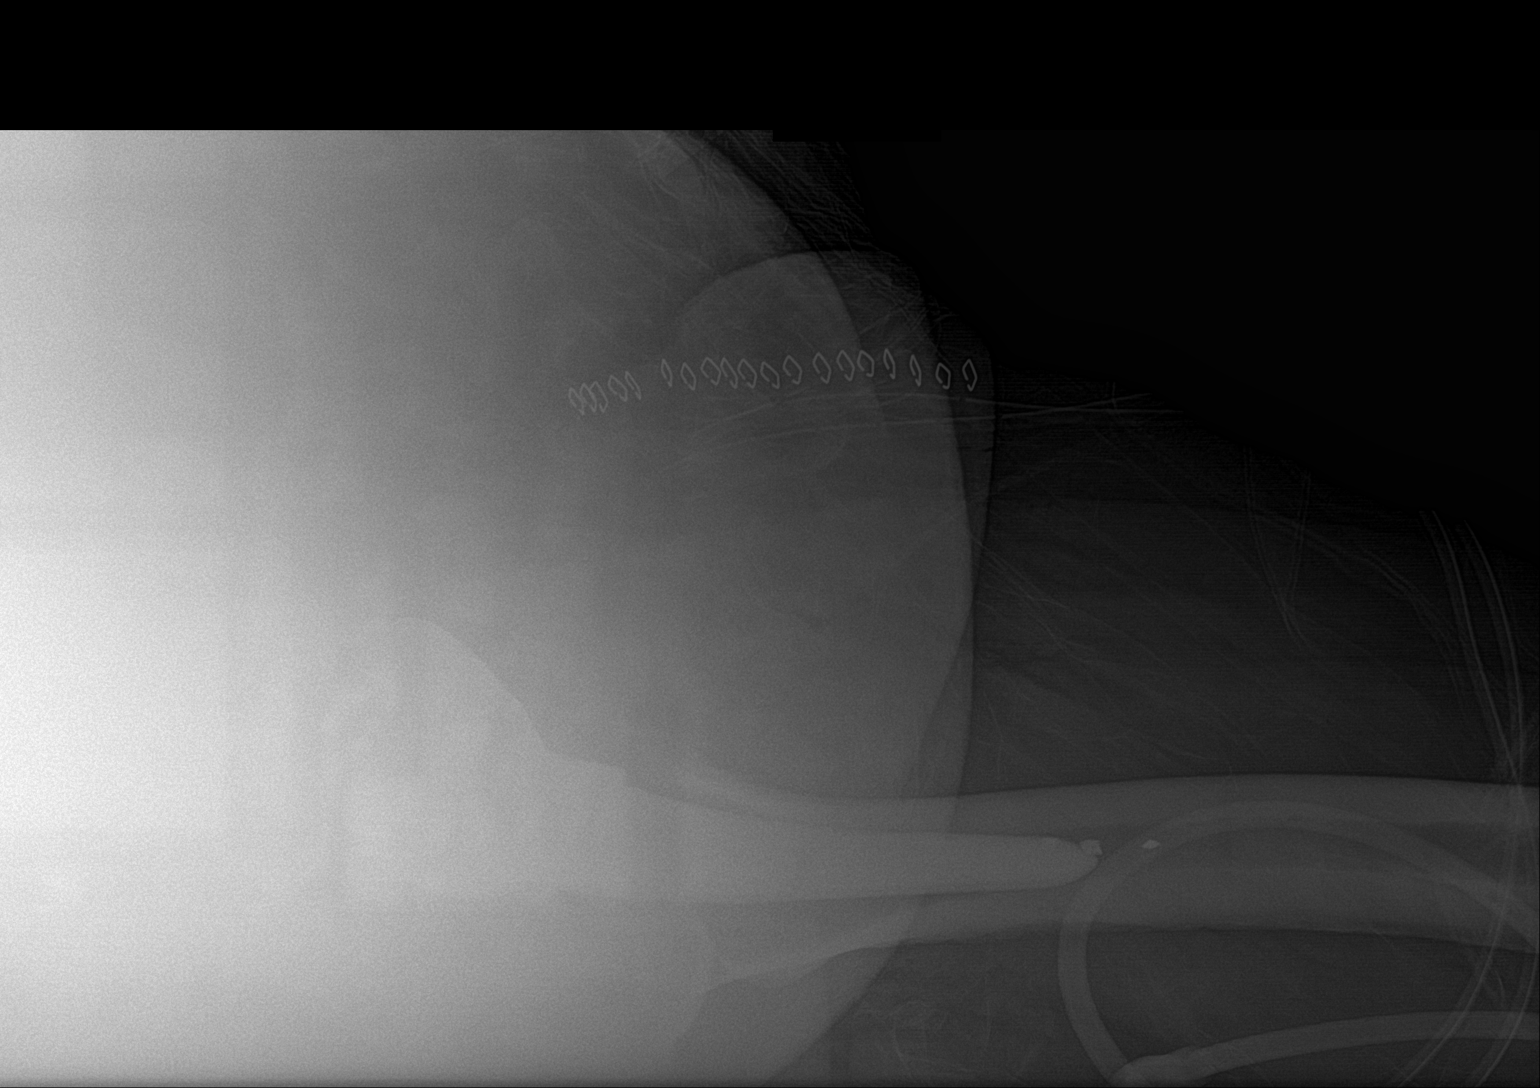

[2 of 2 positions shown; findings below may reference images not displayed]

FINDINGS: The patient has undergone left hip joint prosthesis placement.
Radiographic positioning of the prosthetic components is good. The
native bone exhibits no acute abnormality. Surgical drain lines and
skin staples are present.
IMPRESSION: AP and lateral portable radiographs postoperatively reveal no
immediate postprocedure complication.

## 2017-05-06 DIAGNOSIS — Z0000 Encounter for general adult medical examination without abnormal findings: Secondary | ICD-10-CM | POA: Insufficient documentation

## 2017-10-17 ENCOUNTER — Other Ambulatory Visit: Payer: Self-pay | Admitting: Cardiovascular Disease

## 2017-11-04 ENCOUNTER — Ambulatory Visit (INDEPENDENT_AMBULATORY_CARE_PROVIDER_SITE_OTHER): Payer: Medicare Other | Admitting: Cardiovascular Disease

## 2017-11-04 ENCOUNTER — Encounter: Payer: Self-pay | Admitting: Cardiovascular Disease

## 2017-11-04 VITALS — BP 132/74 | HR 57 | Ht 69.0 in | Wt 269.0 lb

## 2017-11-04 DIAGNOSIS — E7800 Pure hypercholesterolemia, unspecified: Secondary | ICD-10-CM

## 2017-11-04 DIAGNOSIS — I2510 Atherosclerotic heart disease of native coronary artery without angina pectoris: Secondary | ICD-10-CM | POA: Diagnosis not present

## 2017-11-04 DIAGNOSIS — I10 Essential (primary) hypertension: Secondary | ICD-10-CM | POA: Diagnosis not present

## 2017-11-04 NOTE — Assessment & Plan Note (Signed)
History of CAD status post LAD CTO PCI and stenting by myself 06/14/11. I placed a resolute drug-eluting stent at that time. A recent Myoview done for preoperative clearance before a left total hip replacement 10/14/16 was completely normal. He denies chest pain or shortness of breath.

## 2017-11-04 NOTE — Patient Instructions (Signed)

## 2017-11-04 NOTE — Progress Notes (Signed)
11/04/2017 Darrell Boatmanobert W Helder   11/10/1946  [id number]  Primary Physician Marisue IvanLinthavong, [place], MD Primary Cardiologist: Runell GessJonathan J Malone Vanblarcom MD FACP, [place]ACC, [place], [place]  HPI:  Darrell Jennings is a 71 y.o. male moderately overweight divorced Caucasian male, father of 1, grandfather to 3 grandchildren, who I last saw in the office  09/28/16. He was initially referred to me by Dr. Mariel KanskyKen Fath [facility] for treatment of an LAD CTO, which I successfully opened with a Resolute drug-eluting stent. Since that time, he has seen me back in followup. His other problems include hypertension and hyperlipidemia. His symptoms resolved after this intervention, and his Myoview performed November 02, 2011, showed no ischemia.he had a Myoview performed prior to extensive sinus surgery for preoperative clearance 11/07/13 which was normal. He does complain of occasional substernal chest pain with left upper extremity radiation. His most recent lipid profile performed 10/31/17 revealed total cholesterol of 125, LDL of 74 and HDL of 30.   [facility] and recuperated nicely. I did perform a Myoview stress test on him 10/14/16 for preoperative clearance and this was entirely normal. Since I saw me ago he's remained currently stable denying chest pain or shortness of breath.     Current Meds  Medication Sig  . arginine 500 MG tablet Take 1,000 mg by mouth daily with lunch.   Marland Kitchen. aspirin EC 81 MG tablet Take 81 mg by mouth daily.  . B Complex-C (B-COMPLEX WITH VITAMIN C) tablet Take 1 tablet by mouth daily with lunch.  . clopidogrel (PLAVIX) 75 MG tablet TAKE 1 TABLET(75 MG) BY MOUTH DAILY  . Coenzyme Q10 (CO Q 10) 100 MG CAPS Take 100 mg by mouth daily with lunch.   Marland Kitchen. DHEA 25 MG CAPS Take 25 mg by mouth daily with lunch.   . fish oil-omega-3 fatty acids 1000 MG capsule Take 1 g by mouth at bedtime.   . Garlic 1000 MG CAPS Take 2,000 mg by mouth daily with  lunch.  . ibuprofen (ADVIL,MOTRIN) 200 MG tablet Take 400-600 mg by mouth every 8 (eight) hours as needed (for pain.).  Marland Kitchen. lisinopril (PRINIVIL,ZESTRIL) 5 MG tablet TAKE 1 TABLET(5 MG) BY MOUTH DAILY  . metoprolol succinate (TOPROL-XL) 25 MG 24 hr tablet TAKE 1 TABLET(25 MG) BY MOUTH DAILY  . Multiple Vitamin (MULTIVITAMIN WITH MINERALS) TABS Take 1 tablet by mouth daily with lunch.   . oxyCODONE (OXY IR/ROXICODONE) 5 MG immediate release tablet Take 1-2 tablets (5-10 mg total) by mouth every 4 (four) hours as needed for breakthrough pain.  . saw palmetto 160 MG capsule Take 160 mg by mouth daily with lunch.   . Selenium 100 MCG TABS Take 1 tablet by mouth daily.  . simvastatin (ZOCOR) 20 MG tablet TAKE 1 TABLET(20 MG) BY MOUTH AT BEDTIME     No Known Allergies  Social History   Socioeconomic History  . Marital status: Single    Spouse name: Not on file  . Number of children: 1  . Years of education: Not on file  . Highest education level: Not on file  Social Needs  . Financial resource strain: Not on file  . Food insecurity - worry: Not on file  . Food insecurity - inability: Not on file  . Transportation needs - medical: Not on file  . Transportation needs - non-medical: Not on file  Occupational History  . Not on file  Tobacco Use  .  Smoking status: Former Smoker    Last attempt to quit: 12/27/1973    Years since quitting: 43.8  . Smokeless tobacco: Never Used  Substance and Sexual Activity  . Alcohol use: Yes    Comment: beer  every once in while  . Drug use: No  . Sexual activity: Not on file  Other Topics Concern  . Not on file  Social History Narrative  . Not on file     Review of Systems: General: negative for chills, fever, night sweats or weight changes.  Cardiovascular: negative for chest pain, dyspnea on exertion, edema, orthopnea, palpitations, paroxysmal nocturnal dyspnea or shortness of breath Dermatological: negative for rash Respiratory: negative for  cough or wheezing Urologic: negative for hematuria Abdominal: negative for nausea, vomiting, diarrhea, bright red blood per rectum, melena, or hematemesis Neurologic: negative for visual changes, syncope, or dizziness All other systems reviewed and are otherwise negative except as noted above.    Blood pressure 132/74, pulse (!) 57, height 5\' 9"  (1.753 m), weight 269 lb (122 kg).  General appearance: alert and no distress Neck: no adenopathy, no carotid bruit, no JVD, supple, symmetrical, trachea midline and thyroid not enlarged, symmetric, no tenderness/mass/nodules Lungs: clear to auscultation bilaterally Heart: regular rate and rhythm, S1, S2 normal, no murmur, click, rub or gallop Extremities: extremities normal, atraumatic, no cyanosis or edema Pulses: 2+ and symmetric Skin: Skin color, texture, turgor normal. No rashes or lesions Neurologic: Alert and oriented X 3, normal strength and tone. Normal symmetric reflexes. Normal coordination and gait  EKG  Sinus bradycardia at 57 with septal Q waves. I personally reviewed this EKG.   ASSESSMENT AND PLAN:   Coronary artery disease History of CAD status post LAD CTO PCI and stenting by myself 06/14/11. I placed a resolute drug-eluting stent at that time. A recent Myoview done for preoperative clearance before a left total hip replacement 10/14/16 was completely normal. He denies chest pain or shortness of breath.  Essential hypertension History of essential hypertension blood pressure measured 132/74. He is on lisinopril and metoprolol. Continue current meds are current dosing  Hyperlipidemia History of hyperlipidemia on statin therapy with recent lipid profile performed by his PCP 10/31/17 with a total cholesterol of 125 and LDL of 74 and HDL of 29.      Runell GessJonathan J. Tammy Wickliffe MD FACP,FACC,FAHA, [facility] 11/04/2017 1:37 PM

## 2017-11-04 NOTE — Assessment & Plan Note (Signed)
History of hyperlipidemia on statin therapy with recent lipid profile performed by his PCP 10/31/17 with a total cholesterol of 125 and LDL of 74 and HDL of 29.

## 2017-11-04 NOTE — Assessment & Plan Note (Signed)
History of essential hypertension blood pressure measured 132/74. He is on lisinopril and metoprolol. Continue current meds are current dosing

## 2018-01-16 ENCOUNTER — Other Ambulatory Visit: Payer: Self-pay | Admitting: Cardiovascular Disease

## 2018-04-17 ENCOUNTER — Other Ambulatory Visit: Payer: Self-pay | Admitting: Cardiovascular Disease

## 2018-04-17 NOTE — Telephone Encounter (Signed)
Rx request sent to pharmacy.  

## 2018-08-01 ENCOUNTER — Other Ambulatory Visit: Payer: Self-pay | Admitting: Cardiovascular Disease

## 2018-08-02 NOTE — Telephone Encounter (Signed)
Rx sent to pharmacy   

## 2018-12-13 ENCOUNTER — Ambulatory Visit (INDEPENDENT_AMBULATORY_CARE_PROVIDER_SITE_OTHER): Payer: Medicare Other | Admitting: Cardiovascular Disease

## 2018-12-13 ENCOUNTER — Encounter: Payer: Self-pay | Admitting: Cardiovascular Disease

## 2018-12-13 VITALS — BP 136/76 | HR 58 | Ht 69.0 in | Wt 263.0 lb

## 2018-12-13 DIAGNOSIS — I10 Essential (primary) hypertension: Secondary | ICD-10-CM | POA: Diagnosis not present

## 2018-12-13 DIAGNOSIS — E7800 Pure hypercholesterolemia, unspecified: Secondary | ICD-10-CM

## 2018-12-13 MED ORDER — PITAVASTATIN CALCIUM 2 MG PO TABS: 2.0000 mg | ORAL_TABLET | Freq: Every day | ORAL | 3 refills | Status: DC

## 2018-12-13 MED ORDER — CO Q 10 100 MG PO CAPS: 200.0000 mg | ORAL_CAPSULE | Freq: Every day | ORAL | 3 refills | Status: AC

## 2018-12-13 NOTE — Progress Notes (Signed)
12/13/2018 Darrell Jennings   [birth year]  [id number]  Primary Physician Darrell Ivan, MD Primary Cardiologist: Darrell Gess MD FACP, [place], [place], [place]  HPI:  Darrell Jennings is a 72 y.o.  moderately overweight divorced Caucasian male, father of 1, grandfather to 3 grandchildren, who I last saw in the office  11/04/2017. He was initially referred to me by Dr. Mariel Jennings at [facility] for treatment of an LAD CTO, which I successfully opened with a Resolute drug-eluting stent. Since that time, he has seen me back in followup. His other problems include hypertension and hyperlipidemia. His symptoms resolved after this intervention, and his Myoview performed November 02, 2011, showed no ischemia.he had a Myoview performed prior to extensive sinus surgery for preoperative clearance 11/07/13 which was normal. He does complain of occasional substernal chest pain with left upper extremity radiation. His most recent lipid profile performed  11/08/2018 revealed total cholesterol of 125, LDL of 73 and HDL of 31.Marland Kitchen  He underwent elective total left total hip [facility] in [facility] 11/09/16 and recuperated nicely. I did perform a Myoview stress test on him 10/14/16 for preoperative clearance and this was entirely normal. Since I saw me ago he's remained currently stable denying chest pain or shortness of breath.  Since him a year ago he is remained stable has been asymptomatic.  His only complaint is some myalgias and arthralgias which he attributes to his simvastatin.   Current Meds  Medication Sig  . arginine 500 MG tablet Take 1,000 mg by mouth daily with lunch.   Marland Kitchen aspirin EC 81 MG tablet Take 81 mg by mouth daily.  . B Complex-C (B-COMPLEX WITH VITAMIN C) tablet Take 1 tablet by mouth daily with lunch.  . clopidogrel (PLAVIX) 75 MG tablet TAKE 1 TABLET BY MOUTH DAILY  . Coenzyme Q10 (CO Q 10) 100 MG CAPS Take 100 mg by mouth daily with lunch.   Marland Kitchen DHEA 25 MG CAPS Take 25 mg by mouth  daily with lunch.   . fish oil-omega-3 fatty acids 1000 MG capsule Take 1 g by mouth at bedtime.   . Garlic 1000 MG CAPS Take 2,000 mg by mouth daily with lunch.  . ibuprofen (ADVIL,MOTRIN) 200 MG tablet Take 400-600 mg by mouth every 8 (eight) hours as needed (for pain.).  Marland Kitchen lisinopril (PRINIVIL,ZESTRIL) 5 MG tablet TAKE 1 TABLET(5 MG) BY MOUTH DAILY  . metoprolol succinate (TOPROL-XL) 25 MG 24 hr tablet TAKE 1 TABLET(25 MG) BY MOUTH DAILY  . Multiple Vitamin (MULTIVITAMIN WITH MINERALS) TABS Take 1 tablet by mouth daily with lunch.   . oxyCODONE (OXY IR/ROXICODONE) 5 MG immediate release tablet Take 1-2 tablets (5-10 mg total) by mouth every 4 (four) hours as needed for breakthrough pain.  . saw palmetto 160 MG capsule Take 160 mg by mouth daily with lunch.   . Selenium 100 MCG TABS Take 1 tablet by mouth daily.  . simvastatin (ZOCOR) 20 MG tablet TAKE 1 TABLET BY MOUTH AT BEDTIME     No Known Allergies  Social History   Socioeconomic History  . Marital status: Single    Spouse name: Not on file  . Number of children: 1  . Years of education: Not on file  . Highest education level: Not on file  Occupational History  . Not on file  Social Needs  . Financial resource strain: Not on file  . Food insecurity:    Worry: Not on file    Inability: Not on  file  . Transportation needs:    Medical: Not on file    Non-medical: Not on file  Tobacco Use  . Smoking status: Former Smoker    Last attempt to quit: 12/27/1973    Years since quitting: 44.9  . Smokeless tobacco: Never Used  Substance and Sexual Activity  . Alcohol use: Yes    Comment: beer  every once in while  . Drug use: No  . Sexual activity: Not on file  Lifestyle  . Physical activity:    Days per week: Not on file    Minutes per session: Not on file  . Stress: Not on file  Relationships  . Social connections:    Talks on phone: Not on file    Gets together: Not on file    Attends religious service: Not on file     Active member of club or organization: Not on file    Attends meetings of clubs or organizations: Not on file    Relationship status: Not on file  . Intimate partner violence:    Fear of current or ex partner: Not on file    Emotionally abused: Not on file    Physically abused: Not on file    Forced sexual activity: Not on file  Other Topics Concern  . Not on file  Social History Narrative  . Not on file     Review of Systems: General: negative for chills, fever, night sweats or weight changes.  Cardiovascular: negative for chest pain, dyspnea on exertion, edema, orthopnea, palpitations, paroxysmal nocturnal dyspnea or shortness of breath Dermatological: negative for rash Respiratory: negative for cough or wheezing Urologic: negative for hematuria Abdominal: negative for nausea, vomiting, diarrhea, bright red blood per rectum, melena, or hematemesis Neurologic: negative for visual changes, syncope, or dizziness All other systems reviewed and are otherwise negative except as noted above.    Blood pressure 136/76, pulse (!) 58, height 5\' 9"  (1.753 m), weight 263 lb (119.3 kg).  General appearance: alert Neck: no adenopathy, no carotid bruit, no JVD, supple, symmetrical, trachea midline and thyroid not enlarged, symmetric, no tenderness/mass/nodules Lungs: clear to auscultation bilaterally Heart: regular rate and rhythm, S1, S2 normal, no murmur, click, rub or gallop Extremities: extremities normal, atraumatic, no cyanosis or edema Pulses: 2+ and symmetric Skin: Skin color, texture, turgor normal. No rashes or lesions Neurologic: Alert and oriented X 3, normal strength and tone. Normal symmetric reflexes. Normal coordination and gait  EKG sinus bradycardia at 58 septal Q waves.  Personally reviewed this EKG.  ASSESSMENT AND PLAN:   Coronary artery disease History of CAD status post mid LAD CTO PCI and drug-eluting stenting using a resolute drug-eluting stent by myself 06/14/2011.   Myoview done for preoperative clearance before left total hip replacement 10/14/2016 was complete normal.  He denies chest pain or shortness of breath.  Essential hypertension History of essential hypertension with blood pressure measured today 136/76.  He is on lisinopril and metoprolol.  Continue current meds at current dosing.  Hyperlipidemia History of hyperlipidemia on simvastatin with lab work performed by his PCP 11/08/2018 revealing total cholesterol 125, LDL 73 and HDL of 31.  He does complain of some myalgias and arthralgia.  I am going to switch him to Livalo 2 mg a day and we will recheck a lipid liver profile and 2 to 3 months I will have him see him APP back in 6 months.      Darrell GessJonathan J. Fama Muenchow MD FACP,FACC,FAHA, [facility] 12/13/2018 10:59 AM

## 2018-12-13 NOTE — Patient Instructions (Signed)
Medication Instructions:  Your physician has recommended you make the following change in your medication:   STOP SIMVASTATIN (ZOCOR)  START PITAVASTATIN (LIVALO) 2MG  BY MOUTH DAILY  START CO-Q10 200MG  BY MOUTH DAILY  If you need a refill on your cardiac medications before your next appointment, please call your pharmacy.   Lab work: Your physician recommends that you return for lab work in: 2 MONTHS LIPID, LIVER  If you have labs (blood work) drawn today and your tests are completely normal, you will receive your results only by: Marland Kitchen. MyChart Message (if you have MyChart) OR . A paper copy in the mail If you have any lab test that is abnormal or we need to change your treatment, we will call you to review the results.  Testing/Procedures: NONE  Follow-Up: At [facility], you and your health needs are our priority.  As part of our continuing mission to provide you with exceptional heart care, we have created designated Provider Care Teams.  These Care Teams include your primary Cardiologist (physician) and Advanced Practice Providers (APPs -  Physician Assistants and Nurse Practitioners) who all work together to provide you with the care you need, when you need it. You will need a follow up appointment in 6 months WITH AN APP AND IN 12 MONTHS WITH DR. Allyson SabalBERRY.  Please call our office 2 months in advance to schedule this appointment.  You may see one of the following Advanced Practice Providers on your designated Care Team:   Corine ShelterLuke Kilroy, PA-C Judy PimpleKrista Kroeger, [place] JerseyPA-C . Marjie Skiffallie Goodrich, PA-C

## 2018-12-13 NOTE — Assessment & Plan Note (Signed)
History of essential hypertension with blood pressure measured today 136/76.  He is on lisinopril and metoprolol.  Continue current meds at current dosing.

## 2018-12-13 NOTE — Assessment & Plan Note (Signed)
History of CAD status post mid LAD CTO PCI and drug-eluting stenting using a resolute drug-eluting stent by myself 06/14/2011.  Myoview done for preoperative clearance before left total hip replacement 10/14/2016 was complete normal.  He denies chest pain or shortness of breath.

## 2018-12-13 NOTE — Assessment & Plan Note (Signed)
History of hyperlipidemia on simvastatin with lab work performed by his PCP 11/08/2018 revealing total cholesterol 125, LDL 73 and HDL of 31.  He does complain of some myalgias and arthralgia.  I am going to switch him to Livalo 2 mg a day and we will recheck a lipid liver profile and 2 to 3 months I will have him see him APP back in 6 months.

## 2018-12-15 ENCOUNTER — Telehealth: Payer: Self-pay | Admitting: *Deleted

## 2018-12-15 ENCOUNTER — Encounter: Payer: Self-pay | Admitting: *Deleted

## 2018-12-15 MED ORDER — ROSUVASTATIN CALCIUM 10 MG PO TABS: 10.0000 mg | ORAL_TABLET | Freq: Every day | ORAL | 3 refills | Status: DC

## 2018-12-15 NOTE — Telephone Encounter (Signed)
° ° °  Please return call to patient °

## 2018-12-15 NOTE — Telephone Encounter (Signed)
Spoke with pt, aware will try rosuvastatin 10 mg once daily as he has not tried anything but simvastatin in the past. He has the paperwork to recheck labs and will call with any problems.

## 2018-12-15 NOTE — Telephone Encounter (Signed)
Received fax from pharmacy, patients insurance does not cover livalo. He will need to try atorvastatin or rosuvastatin first. Left message for pt to call for patient to call to discuss.

## 2018-12-15 NOTE — Telephone Encounter (Signed)
This encounter was created in error - please disregard.

## 2019-01-31 ENCOUNTER — Other Ambulatory Visit: Payer: Self-pay | Admitting: Cardiovascular Disease

## 2019-02-01 ENCOUNTER — Telehealth: Payer: Self-pay | Admitting: Cardiovascular Disease

## 2019-02-01 MED ORDER — SIMVASTATIN 20 MG PO TABS: 20.0000 mg | ORAL_TABLET | Freq: Every day | ORAL | 3 refills | Status: DC

## 2019-02-01 NOTE — Telephone Encounter (Signed)
Spoke with patient regarding Simvastatin. Patient was seen in December and suggested that he stop Simvastatin and start Livalo. Patients insurance did not cover so it was suggested that he start Rosuvastatin 10 mg daily as he had only taken Simvastatin. Patient stated he did not start secondary to costs but his aching has improved. He is currently taking the Simvastatin 20 mg daily and requested refill. Discussed with Roxine Caddy D and ok to refill Simvastatin

## 2019-02-01 NOTE — Telephone Encounter (Signed)
New message   Pt c/o medication issue:  1. Name of Medication: simostatin  20 mg  2. How are you currently taking this medication (dosage and times per day)? 1 time daily  3. Are you having a reaction (difficulty breathing--STAT)? No   4. What is your medication issue? Patient wants to change this medication to another medication. Please advise.

## 2019-02-01 NOTE — Telephone Encounter (Signed)
Tried to call patient, no voicemail set up. Will try again

## 2019-02-12 ENCOUNTER — Telehealth: Payer: Self-pay | Admitting: Cardiovascular Disease

## 2019-02-12 MED ORDER — LISINOPRIL 5 MG PO TABS: ORAL_TABLET | ORAL | 1 refills | Status: DC

## 2019-02-12 NOTE — Telephone Encounter (Signed)
PHARMACIST STATES THAT THEY JUST NEED A REFILL REFILL SENT  PT NOTIFIED

## 2019-02-12 NOTE — Telephone Encounter (Signed)
New Message    Patient states he received phone call from [facility] stating his medication Lisinopril wasn't approved and he needs a nurse to call him back.

## 2019-07-28 ENCOUNTER — Other Ambulatory Visit: Payer: Self-pay | Admitting: Cardiovascular Disease

## 2019-07-30 ENCOUNTER — Telehealth: Payer: Self-pay | Admitting: *Deleted

## 2019-07-30 NOTE — Telephone Encounter (Signed)
A message was left, re: follow up visit. 

## 2019-08-02 ENCOUNTER — Other Ambulatory Visit: Payer: Self-pay | Admitting: Cardiovascular Disease

## 2019-10-05 ENCOUNTER — Encounter: Payer: Self-pay | Admitting: Cardiovascular Disease

## 2019-10-05 ENCOUNTER — Other Ambulatory Visit: Payer: Self-pay

## 2019-10-05 ENCOUNTER — Ambulatory Visit (INDEPENDENT_AMBULATORY_CARE_PROVIDER_SITE_OTHER): Payer: Medicare Other | Admitting: Cardiovascular Disease

## 2019-10-05 DIAGNOSIS — E782 Mixed hyperlipidemia: Secondary | ICD-10-CM

## 2019-10-05 DIAGNOSIS — I10 Essential (primary) hypertension: Secondary | ICD-10-CM

## 2019-10-05 DIAGNOSIS — I2510 Atherosclerotic heart disease of native coronary artery without angina pectoris: Secondary | ICD-10-CM

## 2019-10-05 NOTE — Patient Instructions (Addendum)
Medication Instructions:  Your physician recommends that you continue on your current medications as directed. Please refer to the Current Medication list given to you today.  If you need a refill on your cardiac medications before your next appointment, please call your pharmacy.   Lab work: None. PLEASE HAVE DR. [place] OFFICE FAX YOUR RECENT LAB WORK TO OUR OFFICE AT [phone].  If you have labs (blood work) drawn today and your tests are completely normal, you will receive your results only by: Marland Kitchen MyChart Message (if you have MyChart) OR . A paper copy in the mail If you have any lab test that is abnormal or we need to change your treatment, we will call you to review the results.  Testing/Procedures: NONE  Follow-Up: At [facility], you and your health needs are our priority.  As part of our continuing mission to provide you with exceptional heart care, we have created designated Provider Care Teams.  These Care Teams include your primary Cardiologist (physician) and Advanced Practice Providers (APPs -  Physician Assistants and Nurse Practitioners) who all work together to provide you with the care you need, when you need it. You will need a follow up appointment in 12 months with Dr. Quay Burow.  Please call our office 2 months in advance to schedule this appointment.

## 2019-10-05 NOTE — Progress Notes (Signed)
10/05/2019 Darrell Jennings   1946-12-18  [id number]  Primary Physician Darrell Ivan, MD Primary Cardiologist: Darrell Gess MD FACP, [place], [place], [place]  HPI:  Darrell Jennings is a 73 y.o.  moderately overweight divorced Caucasian male, father of 1, grandfather to 3 grandchildren, who I last saw in the office  12/13/2018. He was initially referred to me by Dr. Mariel Jennings at [facility] for treatment of an LAD CTO, which I successfully opened with a Resolute drug-eluting stent. Since that time, he has seen me back in followup. His other problems include hypertension and hyperlipidemia. His symptoms resolved after this intervention, and his Myoview performed November 02, 2011, showed no ischemia.he had a Myoview performed prior to extensive sinus surgery for preoperative clearance 11/07/13 which was normal. He does complain of occasional substernal chest pain with left upper extremity radiation. His most recent lipid profile performed 11/08/2018 revealed total cholesterol of 125, LDL of 73 and HDL of 31.Marland Kitchen He underwent elective total left total hip replacement in [facility] 11/09/16 and recuperated nicely. I did perform a Myoview stress test on him 10/14/16 for preoperative clearance and this was entirely normal. Since I saw me ago he's remained currently stable denying chest pain or shortness of breath.  Since him a year ago he is remained stable has been asymptomatic.    He denies chest pain or shortness of breath.  He was having some arthralgias and myalgias thought to be related to statin therapy although he still on simvastatin.   Current Meds  Medication Sig  . arginine 500 MG tablet Take 1,000 mg by mouth daily with lunch.   Marland Kitchen aspirin EC 81 MG tablet Take 81 mg by mouth daily.  . B Complex-C (B-COMPLEX WITH VITAMIN C) tablet Take 1 tablet by mouth daily with lunch.  . clopidogrel (PLAVIX) 75 MG tablet TAKE 1 TABLET BY MOUTH DAILY  . Coenzyme Q10 (CO Q 10) 100 MG CAPS Take 200  mg by mouth daily with lunch.  Marland Kitchen DHEA 25 MG CAPS Take 25 mg by mouth daily with lunch.   . fish oil-omega-3 fatty acids 1000 MG capsule Take 1 g by mouth at bedtime.   . Garlic 1000 MG CAPS Take 2,000 mg by mouth daily with lunch.  . ibuprofen (ADVIL,MOTRIN) 200 MG tablet Take 400-600 mg by mouth every 8 (eight) hours as needed (for pain.).  Marland Kitchen lisinopril (ZESTRIL) 5 MG tablet TAKE 1 TABLET BY MOUTH DAILY  . metoprolol succinate (TOPROL-XL) 25 MG 24 hr tablet TAKE 1 TABLET BY MOUTH EVERY DAY  . Multiple Vitamin (MULTIVITAMIN WITH MINERALS) TABS Take 1 tablet by mouth daily with lunch.   . oxyCODONE (OXY IR/ROXICODONE) 5 MG immediate release tablet Take 1-2 tablets (5-10 mg total) by mouth every 4 (four) hours as needed for breakthrough pain.  . saw palmetto 160 MG capsule Take 160 mg by mouth daily with lunch.   . Selenium 100 MCG TABS Take 1 tablet by mouth daily.     No Known Allergies  Social History   Socioeconomic History  . Marital status: Single    Spouse name: Not on file  . Number of children: 1  . Years of education: Not on file  . Highest education level: Not on file  Occupational History  . Not on file  Social Needs  . Financial resource strain: Not on file  . Food insecurity    Worry: Not on file    Inability: Not on file  .  Transportation needs    Medical: Not on file    Non-medical: Not on file  Tobacco Use  . Smoking status: Former Smoker    Quit date: 12/27/1973    Years since quitting: 45.8  . Smokeless tobacco: Never Used  Substance and Sexual Activity  . Alcohol use: Yes    Comment: beer  every once in while  . Drug use: No  . Sexual activity: Not on file  Lifestyle  . Physical activity    Days per week: Not on file    Minutes per session: Not on file  . Stress: Not on file  Relationships  . Social Herbalist on phone: Not on file    Gets together: Not on file    Attends religious service: Not on file    Active member of club or  organization: Not on file    Attends meetings of clubs or organizations: Not on file    Relationship status: Not on file  . Intimate partner violence    Fear of current or ex partner: Not on file    Emotionally abused: Not on file    Physically abused: Not on file    Forced sexual activity: Not on file  Other Topics Concern  . Not on file  Social History Narrative  . Not on file     Review of Systems: General: negative for chills, fever, night sweats or weight changes.  Cardiovascular: negative for chest pain, dyspnea on exertion, edema, orthopnea, palpitations, paroxysmal nocturnal dyspnea or shortness of breath Dermatological: negative for rash Respiratory: negative for cough or wheezing Urologic: negative for hematuria Abdominal: negative for nausea, vomiting, diarrhea, bright red blood per rectum, melena, or hematemesis Neurologic: negative for visual changes, syncope, or dizziness All other systems reviewed and are otherwise negative except as noted above.    Blood pressure 130/68, pulse (!) 59, temperature 97.9 F (36.6 C), height 5\' 9"  (1.753 m), weight 261 lb (118.4 kg).  General appearance: alert and no distress Neck: no adenopathy, no carotid bruit, no JVD, supple, symmetrical, trachea midline and thyroid not enlarged, symmetric, no tenderness/mass/nodules Lungs: clear to auscultation bilaterally Heart: regular rate and rhythm, S1, S2 normal, no murmur, click, rub or gallop Extremities: extremities normal, atraumatic, no cyanosis or edema Pulses: 2+ and symmetric Skin: Skin color, texture, turgor normal. No rashes or lesions Neurologic: Alert and oriented X 3, normal strength and tone. Normal symmetric reflexes. Normal coordination and gait  EKG sinus bradycardia at 59 with septal Q waves.  I personally reviewed this EKG.  ASSESSMENT AND PLAN:   Coronary artery disease History of CAD status post LAD CTO intervention by myself 06/14/2011 with a long drug-eluting  stent.  He had no other significant CAD.  He did have a Myoview performed 10/14/2016 for preoperative clearance before hip replacement which is entirely normal.  He denies chest pain or shortness of breath.  Essential hypertension History of essential hypertension with blood pressure measured at 130/68.  He is on lisinopril and metoprolol.  Hyperlipidemia History of hyperlipidemia on statin therapy followed by his PCP      Lorretta Harp MD [facility] 10/05/2019 10:03 AM

## 2019-10-05 NOTE — Assessment & Plan Note (Signed)
History of CAD status post LAD CTO intervention by myself 06/14/2011 with a long drug-eluting stent.  He had no other significant CAD.  He did have a Myoview performed 10/14/2016 for preoperative clearance before hip replacement which is entirely normal.  He denies chest pain or shortness of breath.

## 2019-10-05 NOTE — Assessment & Plan Note (Signed)
History of hyperlipidemia on statin therapy followed by his PCP 

## 2019-10-05 NOTE — Assessment & Plan Note (Signed)
History of essential hypertension with blood pressure measured at 130/68.  He is on lisinopril and metoprolol.

## 2020-01-09 ENCOUNTER — Other Ambulatory Visit: Payer: Self-pay | Admitting: Orthopedic Surgery

## 2020-01-09 DIAGNOSIS — M4807 Spinal stenosis, lumbosacral region: Secondary | ICD-10-CM

## 2020-01-16 ENCOUNTER — Other Ambulatory Visit: Payer: Self-pay

## 2020-01-16 ENCOUNTER — Ambulatory Visit
Admission: RE | Admit: 2020-01-16 | Discharge: 2020-01-16 | Disposition: A | Discharge status: Home / Self Care | Payer: Medicare Other | Source: Ambulatory Visit | Attending: Orthopedic Surgery | Admitting: Orthopedic Surgery

## 2020-01-16 DIAGNOSIS — M4807 Spinal stenosis, lumbosacral region: Secondary | ICD-10-CM | POA: Insufficient documentation

## 2020-01-25 ENCOUNTER — Other Ambulatory Visit: Payer: Self-pay | Admitting: Cardiovascular Disease

## 2020-01-28 ENCOUNTER — Other Ambulatory Visit: Payer: Self-pay

## 2020-01-28 MED ORDER — SIMVASTATIN 20 MG PO TABS: 20.0000 mg | ORAL_TABLET | Freq: Every day | ORAL | 3 refills | Status: DC

## 2020-04-29 ENCOUNTER — Other Ambulatory Visit: Payer: Self-pay

## 2020-04-29 MED ORDER — LISINOPRIL 5 MG PO TABS: ORAL_TABLET | ORAL | 3 refills | Status: DC

## 2020-08-01 ENCOUNTER — Other Ambulatory Visit: Payer: Self-pay | Admitting: Cardiovascular Disease

## 2020-09-10 ENCOUNTER — Other Ambulatory Visit: Payer: Self-pay

## 2020-09-10 ENCOUNTER — Ambulatory Visit (INDEPENDENT_AMBULATORY_CARE_PROVIDER_SITE_OTHER): Payer: Medicare Other | Admitting: Cardiovascular Disease

## 2020-09-10 ENCOUNTER — Encounter: Payer: Self-pay | Admitting: Cardiovascular Disease

## 2020-09-10 DIAGNOSIS — I10 Essential (primary) hypertension: Secondary | ICD-10-CM | POA: Diagnosis not present

## 2020-09-10 DIAGNOSIS — I2510 Atherosclerotic heart disease of native coronary artery without angina pectoris: Secondary | ICD-10-CM

## 2020-09-10 DIAGNOSIS — E782 Mixed hyperlipidemia: Secondary | ICD-10-CM

## 2020-09-10 NOTE — Assessment & Plan Note (Signed)
History of hyperlipidemia on simvastatin followed by his PCP 

## 2020-09-10 NOTE — Assessment & Plan Note (Signed)
History of CAD status post LAD CTO successful revascularization with resolute drug-eluting stent by myself back in 2012 (06/14/2011) with negative Myoview stress test since that time.  He is totally asymptomatic.

## 2020-09-10 NOTE — Progress Notes (Signed)
09/10/2020 [name]   [birth year]  [id number]  Primary Physician Marisue Ivan, MD Primary Cardiologist: Runell Gess MD FACP, [place], [place], [place]  HPI:  Darrell Jennings is a 74 y.o.  moderately overweight divorced Caucasian male, father of 1, grandfather to 3 grandchildren, who I last saw in the office10/08/2019. He was initially referred to me by Dr. Mariel Kansky at [facility] for treatment of an LAD CTO, which I successfully opened with a Resolute drug-eluting stent. Since that time, he has seen me back in followup. His other problems include hypertension and hyperlipidemia. His symptoms resolved after this intervention, and his Myoview performed November 02, 2011, showed no ischemia.he had a Myoview performed prior to extensive sinus surgery for preoperative clearance 11/07/13 which was normal. He does complain of occasional substernal chest pain with left upper extremity radiation. His most recent lipid profile performed11/13/2019revealed total cholesterol of 125, LDL of 73 and HDL of 31.Marland Kitchen He underwent elective total left total hip replacement in [facility] 11/09/16 and recuperated nicely. I did perform a Myoview stress test on him 10/14/16 for preoperative clearance and this was entirely normal. Since I saw me ago he's remained currently stable denying chest pain or shortness of breath.  Since I saw him a year ago he is remained stable.  He still quite active.  He denies chest pain or shortness of breath.   Current Meds  Medication Sig  . arginine 500 MG tablet Take 1,000 mg by mouth daily with lunch.   Marland Kitchen aspirin EC 81 MG tablet Take 81 mg by mouth daily.  . B Complex-C (B-COMPLEX WITH VITAMIN C) tablet Take 1 tablet by mouth daily with lunch.  . clopidogrel (PLAVIX) 75 MG tablet TAKE 1 TABLET BY MOUTH DAILY  . Coenzyme Q10 (CO Q 10) 100 MG CAPS Take 200 mg by mouth daily with lunch.  Marland Kitchen DHEA 25 MG CAPS Take 25 mg by mouth daily with lunch.   . fish oil-omega-3 fatty  acids 1000 MG capsule Take 1 g by mouth at bedtime.   . Garlic 1000 MG CAPS Take 2,000 mg by mouth daily with lunch.  . ibuprofen (ADVIL,MOTRIN) 200 MG tablet Take 400-600 mg by mouth every 8 (eight) hours as needed (for pain.).  Marland Kitchen lisinopril (ZESTRIL) 5 MG tablet TAKE 1 TABLET BY MOUTH DAILY  . metoprolol succinate (TOPROL-XL) 25 MG 24 hr tablet TAKE 1 TABLET BY MOUTH EVERY DAY  . Multiple Vitamin (MULTIVITAMIN WITH MINERALS) TABS Take 1 tablet by mouth daily with lunch.   . oxyCODONE (OXY IR/ROXICODONE) 5 MG immediate release tablet Take 1-2 tablets (5-10 mg total) by mouth every 4 (four) hours as needed for breakthrough pain.  . saw palmetto 160 MG capsule Take 160 mg by mouth daily with lunch.   . Selenium 100 MCG TABS Take 1 tablet by mouth daily.     No Known Allergies  Social History   Socioeconomic History  . Marital status: Single    Spouse name: Not on file  . Number of children: 1  . Years of education: Not on file  . Highest education level: Not on file  Occupational History  . Not on file  Tobacco Use  . Smoking status: Former Smoker    Quit date: 12/27/1973    Years since quitting: 46.7  . Smokeless tobacco: Never Used  Substance and Sexual Activity  . Alcohol use: Yes    Comment: beer  every once in while  . Drug use:  No  . Sexual activity: Not on file  Other Topics Concern  . Not on file  Social History Narrative  . Not on file   Social Determinants of Health   Financial Resource Strain:   . Difficulty of Paying Living Expenses: Not on file  Food Insecurity:   . Worried About Programme researcher, broadcasting/film/video in the Last Year: Not on file  . Ran Out of Food in the Last Year: Not on file  Transportation Needs:   . Lack of Transportation (Medical): Not on file  . Lack of Transportation (Non-Medical): Not on file  Physical Activity:   . Days of Exercise per Week: Not on file  . Minutes of Exercise per Session: Not on file  Stress:   . Feeling of Stress : Not on file   Social Connections:   . Frequency of Communication with Friends and Family: Not on file  . Frequency of Social Gatherings with Friends and Family: Not on file  . Attends Religious Services: Not on file  . Active Member of Clubs or Organizations: Not on file  . Attends Banker Meetings: Not on file  . Marital Status: Not on file  Intimate Partner Violence:   . Fear of Current or Ex-Partner: Not on file  . Emotionally Abused: Not on file  . Physically Abused: Not on file  . Sexually Abused: Not on file     Review of Systems: General: negative for chills, fever, night sweats or weight changes.  Cardiovascular: negative for chest pain, dyspnea on exertion, edema, orthopnea, palpitations, paroxysmal nocturnal dyspnea or shortness of breath Dermatological: negative for rash Respiratory: negative for cough or wheezing Urologic: negative for hematuria Abdominal: negative for nausea, vomiting, diarrhea, bright red blood per rectum, melena, or hematemesis Neurologic: negative for visual changes, syncope, or dizziness All other systems reviewed and are otherwise negative except as noted above.    Blood pressure 134/74, pulse (!) 55, height 5\' 8"  (1.727 m), weight 267 lb (121.1 kg), SpO2 93 %.  General appearance: alert and no distress Neck: no adenopathy, no carotid bruit, no JVD, supple, symmetrical, trachea midline and thyroid not enlarged, symmetric, no tenderness/mass/nodules Lungs: clear to auscultation bilaterally Heart: regular rate and rhythm, S1, S2 normal, no murmur, click, rub or gallop Extremities: extremities normal, atraumatic, no cyanosis or edema Pulses: 2+ and symmetric Skin: Skin color, texture, turgor normal. No rashes or lesions Neurologic: Alert and oriented X 3, normal strength and tone. Normal symmetric reflexes. Normal coordination and gait  EKG sinus bradycardia 55 without ST or T wave changes.  There were septal Q waves.  I personally reviewed this  EKG.  ASSESSMENT AND PLAN:   Coronary artery disease History of CAD status post LAD CTO successful revascularization with resolute drug-eluting stent by myself back in 2012 (06/14/2011) with negative Myoview stress test since that time.  He is totally asymptomatic.  Essential hypertension History of essential hypertension blood pressure measured today 134/74.  He is on lisinopril and metoprolol.  Hyperlipidemia History of hyperlipidemia on simvastatin followed by his PCP.      06/16/2011 MD FACP,FACC,FAHA, [facility] 09/10/2020 10:02 AM

## 2020-09-10 NOTE — Assessment & Plan Note (Signed)
History of essential hypertension blood pressure measured today 134/74.  He is on lisinopril and metoprolol.

## 2020-09-10 NOTE — Patient Instructions (Signed)

## 2020-10-26 ENCOUNTER — Other Ambulatory Visit: Payer: Self-pay | Admitting: Cardiovascular Disease

## 2020-10-31 ENCOUNTER — Other Ambulatory Visit: Payer: Self-pay | Admitting: Cardiovascular Disease

## 2021-04-23 ENCOUNTER — Other Ambulatory Visit: Payer: Self-pay | Admitting: Cardiovascular Disease

## 2021-08-03 ENCOUNTER — Other Ambulatory Visit: Payer: Self-pay

## 2021-08-03 MED ORDER — CLOPIDOGREL BISULFATE 75 MG PO TABS: 75.0000 mg | ORAL_TABLET | Freq: Every day | ORAL | 2 refills | Status: DC

## 2021-08-03 MED ORDER — METOPROLOL SUCCINATE ER 25 MG PO TB24: 25.0000 mg | ORAL_TABLET | Freq: Every day | ORAL | 2 refills | Status: DC

## 2021-08-05 ENCOUNTER — Telehealth: Payer: Self-pay | Admitting: Cardiovascular Disease

## 2021-08-05 MED ORDER — METOPROLOL SUCCINATE ER 25 MG PO TB24: 25.0000 mg | ORAL_TABLET | Freq: Every day | ORAL | 0 refills | Status: DC

## 2021-08-05 MED ORDER — CLOPIDOGREL BISULFATE 75 MG PO TABS: 75.0000 mg | ORAL_TABLET | Freq: Every day | ORAL | 0 refills | Status: DC

## 2021-08-05 NOTE — Telephone Encounter (Signed)
Refills has been resent to the pharmacy. 

## 2021-08-05 NOTE — Telephone Encounter (Signed)
 *  STAT* If patient is at the pharmacy, call can be transferred to refill team.  1. Which medications need to be refilled? (please list name of each medication and dose if known)   metoprolol succinate (TOPROL-XL) 25 MG 24 hr tablet  clopidogrel (PLAVIX) 75 MG tablet  2. Which pharmacy/location (including street and city if local pharmacy) is medication to be sent to?  [id number] - [place], [place] - [address] AT [facility] OF [address]  3. Do they need a 30 day or 90 day supply? 90 days  Prescription that was sent on 08/03/21 did not go through. Pharmacy never received. Please resend

## 2021-10-31 ENCOUNTER — Other Ambulatory Visit: Payer: Self-pay | Admitting: Cardiovascular Disease

## 2021-11-01 ENCOUNTER — Other Ambulatory Visit: Payer: Self-pay | Admitting: Cardiovascular Disease

## 2021-11-03 ENCOUNTER — Other Ambulatory Visit: Payer: Self-pay | Admitting: Cardiovascular Disease

## 2021-11-05 ENCOUNTER — Other Ambulatory Visit: Payer: Self-pay | Admitting: Cardiovascular Disease

## 2021-11-07 ENCOUNTER — Other Ambulatory Visit: Payer: Self-pay | Admitting: Cardiovascular Disease

## 2021-11-11 ENCOUNTER — Other Ambulatory Visit: Payer: Self-pay | Admitting: Cardiovascular Disease

## 2021-12-01 ENCOUNTER — Ambulatory Visit (INDEPENDENT_AMBULATORY_CARE_PROVIDER_SITE_OTHER): Payer: Medicare Other | Admitting: Cardiovascular Disease

## 2021-12-01 ENCOUNTER — Other Ambulatory Visit: Payer: Self-pay

## 2021-12-01 ENCOUNTER — Encounter: Payer: Self-pay | Admitting: Cardiovascular Disease

## 2021-12-01 DIAGNOSIS — I10 Essential (primary) hypertension: Secondary | ICD-10-CM | POA: Diagnosis not present

## 2021-12-01 DIAGNOSIS — E782 Mixed hyperlipidemia: Secondary | ICD-10-CM

## 2021-12-01 DIAGNOSIS — I2510 Atherosclerotic heart disease of native coronary artery without angina pectoris: Secondary | ICD-10-CM | POA: Diagnosis not present

## 2021-12-01 MED ORDER — CLOPIDOGREL BISULFATE 75 MG PO TABS: ORAL_TABLET | ORAL | 3 refills | Status: DC

## 2021-12-01 MED ORDER — LISINOPRIL 5 MG PO TABS: ORAL_TABLET | ORAL | 3 refills | Status: DC

## 2021-12-01 MED ORDER — METOPROLOL SUCCINATE ER 25 MG PO TB24: ORAL_TABLET | ORAL | 3 refills | Status: DC

## 2021-12-01 MED ORDER — SIMVASTATIN 20 MG PO TABS: ORAL_TABLET | ORAL | 3 refills | Status: DC

## 2021-12-01 NOTE — Assessment & Plan Note (Addendum)
History of hyperlipidemia on statin therapy with lipid profile followed by his PCP.  His most recent lipid profile performed 08/06/2021 revealed total cholesterol 127, LDL 69 and HDL of 30.

## 2021-12-01 NOTE — Patient Instructions (Signed)

## 2021-12-01 NOTE — Progress Notes (Addendum)
12/01/2021 [name]   1946/11/22  [id number]  Primary Physician Marisue Ivan, MD Primary Cardiologist: Runell Gess MD FACP, [place], [place], [place]  HPI:  Darrell Jennings is a 75 y.o.  moderately overweight divorced Caucasian male, father of 1, grandfather to 3 grandchildren, who I last saw in the office 09/10/2020. He was initially referred to me by Dr. Mariel Kansky at [facility] for treatment of an LAD CTO, which I successfully opened with a Resolute drug-eluting stent. Since that time, he has seen me back in followup. His other problems include hypertension and hyperlipidemia. His symptoms resolved after this intervention, and his Myoview performed November 02, 2011, showed no ischemia. he had a Myoview performed prior to extensive sinus surgery for preoperative clearance 11/07/13 which was normal. He does complain of occasional substernal chest pain with left upper extremity radiation. Marland Kitchen   He underwent elective total left total hip replacement in [facility] 11/09/16 and recuperated nicely. I did perform a Myoview stress test on him 10/14/16 for preoperative clearance and this was entirely normal.    Since I saw him a year ago he is remained stable.  He still quite active.  He denies chest pain or shortness of breath.   Current Meds  Medication Sig   arginine 500 MG tablet Take 1,000 mg by mouth daily with lunch.    aspirin EC 81 MG tablet Take 81 mg by mouth daily.   B Complex-C (B-COMPLEX WITH VITAMIN C) tablet Take 1 tablet by mouth daily with lunch.   Coenzyme Q10 (CO Q 10) 100 MG CAPS Take 200 mg by mouth daily with lunch.   DHEA 25 MG CAPS Take 25 mg by mouth daily with lunch.    fish oil-omega-3 fatty acids 1000 MG capsule Take 1 g by mouth at bedtime.    Garlic 1000 MG CAPS Take 2,000 mg by mouth daily with lunch.   ibuprofen (ADVIL,MOTRIN) 200 MG tablet Take 400-600 mg by mouth every 8 (eight) hours as needed (for pain.).   metFORMIN (GLUCOPHAGE-XR) 500 MG 24 hr  tablet SMARTSIG:2 Tablet(s) By Mouth Every Evening   Multiple Vitamin (MULTIVITAMIN WITH MINERALS) TABS Take 1 tablet by mouth daily with lunch.    oxyCODONE (OXY IR/ROXICODONE) 5 MG immediate release tablet Take 1-2 tablets (5-10 mg total) by mouth every 4 (four) hours as needed for breakthrough pain.   saw palmetto 160 MG capsule Take 160 mg by mouth daily with lunch.    Selenium 100 MCG TABS Take 1 tablet by mouth daily.   [DISCONTINUED] clopidogrel (PLAVIX) 75 MG tablet TAKE 1 TABLET(75 MG) BY MOUTH DAILY   [DISCONTINUED] lisinopril (ZESTRIL) 5 MG tablet PATIENT MUST KEEP APPOINTMENT FOR FUTURE REFILLS. TAKE 1 TABLET BY MOUTH EVERY DAY   [DISCONTINUED] metoprolol succinate (TOPROL-XL) 25 MG 24 hr tablet TAKE 1 TABLET(25 MG) BY MOUTH DAILY   [DISCONTINUED] simvastatin (ZOCOR) 20 MG tablet TAKE 1 TABLET(20 MG) BY MOUTH AT BEDTIME     No Known Allergies  Social History   Socioeconomic History   Marital status: Single    Spouse name: Not on file   Number of children: 1   Years of education: Not on file   Highest education level: Not on file  Occupational History   Not on file  Tobacco Use   Smoking status: Former    Types: Cigarettes    Quit date: 12/27/1973    Years since quitting: 47.9   Smokeless tobacco: Never  Substance and Sexual  Activity   Alcohol use: Yes    Comment: beer  every once in while   Drug use: No   Sexual activity: Not on file  Other Topics Concern   Not on file  Social History Narrative   Not on file   Social Determinants of Health   Financial Resource Strain: Not on file  Food Insecurity: Not on file  Transportation Needs: Not on file  Physical Activity: Not on file  Stress: Not on file  Social Connections: Not on file  Intimate Partner Violence: Not on file     Review of Systems: General: negative for chills, fever, night sweats or weight changes.  Cardiovascular: negative for chest pain, dyspnea on exertion, edema, orthopnea, palpitations,  paroxysmal nocturnal dyspnea or shortness of breath Dermatological: negative for rash Respiratory: negative for cough or wheezing Urologic: negative for hematuria Abdominal: negative for nausea, vomiting, diarrhea, bright red blood per rectum, melena, or hematemesis Neurologic: negative for visual changes, syncope, or dizziness All other systems reviewed and are otherwise negative except as noted above.    Blood pressure (!) 148/82, pulse 65, height 5\' 8"  (1.727 m), weight 258 lb 3.2 oz (117.1 kg), SpO2 99 %.  General appearance: alert and no distress Neck: no adenopathy, no carotid bruit, no JVD, supple, symmetrical, trachea midline, and thyroid not enlarged, symmetric, no tenderness/mass/nodules Lungs: clear to auscultation bilaterally Heart: regular rate and rhythm, S1, S2 normal, no murmur, click, rub or gallop Extremities: extremities normal, atraumatic, no cyanosis or edema Pulses: 2+ and symmetric Skin: Skin color, texture, turgor normal. No rashes or lesions Neurologic: Grossly normal  EKG sinus rhythm at 65 with septal Q waves.  Left axis deviation.  I personally reviewed this EKG.  ASSESSMENT AND PLAN:   Coronary artery disease History of CAD status post LAD CTO intervention by myself 06/14/2011.  Subsequent Myoview stress test have all been negative for ischemia.  He is totally asymptomatic.  Essential hypertension History of essential hypertension a blood pressure measured at 148/82.  He is on lisinopril and metoprolol.  Hyperlipidemia History of hyperlipidemia on statin therapy with lipid profile followed by his PCP.  His most recent lipid profile performed 08/06/2021 revealed total cholesterol 127, LDL 69 and HDL of 30.     Lorretta Harp MD FACP,FACC,FAHA, [facility] 12/01/2021 10:13 AM

## 2021-12-01 NOTE — Assessment & Plan Note (Signed)
History of CAD status post LAD CTO intervention by myself 06/14/2011.  Subsequent Myoview stress test have all been negative for ischemia.  He is totally asymptomatic.

## 2021-12-01 NOTE — Assessment & Plan Note (Signed)
History of essential hypertension a blood pressure measured at 148/82.  He is on lisinopril and metoprolol.

## 2022-11-27 ENCOUNTER — Other Ambulatory Visit: Payer: Self-pay | Admitting: Cardiovascular Disease

## 2022-11-30 ENCOUNTER — Encounter: Payer: Self-pay | Admitting: Cardiovascular Disease

## 2022-11-30 ENCOUNTER — Ambulatory Visit: Payer: Medicare Other | Attending: Cardiovascular Disease | Admitting: Cardiovascular Disease

## 2022-11-30 VITALS — BP 146/72 | HR 62 | Ht 69.0 in | Wt 262.8 lb

## 2022-11-30 DIAGNOSIS — I10 Essential (primary) hypertension: Secondary | ICD-10-CM | POA: Insufficient documentation

## 2022-11-30 DIAGNOSIS — E782 Mixed hyperlipidemia: Secondary | ICD-10-CM | POA: Diagnosis not present

## 2022-11-30 DIAGNOSIS — I2510 Atherosclerotic heart disease of native coronary artery without angina pectoris: Secondary | ICD-10-CM | POA: Insufficient documentation

## 2022-11-30 NOTE — Progress Notes (Signed)
11/30/2022 Darrell Jennings   [birth year]  [id number]  Primary Physician Marisue Ivan, MD Primary Cardiologist: Runell Gess MD FACP, [place], [place], [place]  HPI:  Darrell Jennings is a 76 y.o.  moderately overweight divorced Caucasian male, father of 1, grandfather to 3 grandchildren, who I last saw in the office 12/01/2021. He was initially referred to me by Dr. Mariel Jennings at [facility] for treatment of an LAD CTO, which I successfully opened with a Resolute drug-eluting stent. Since that time, he has seen me back in followup. His other problems include hypertension and hyperlipidemia. His symptoms resolved after this intervention, and his Myoview performed November 02, 2011, showed no ischemia. he had a Myoview performed prior to extensive sinus surgery for preoperative clearance 11/07/13 which was normal. He does complain of occasional substernal chest pain with left upper extremity radiation. Marland Kitchen   He underwent elective total left total hip replacement in [facility] 11/09/16 and recuperated nicely. I did perform a Myoview stress test on him 10/14/16 for preoperative clearance and this was entirely normal.    Since I saw him a year ago he is remained stable.  He still quite active.  He denies chest pain or shortness of breath.   Current Meds  Medication Sig   arginine 500 MG tablet Take 1,000 mg by mouth daily with lunch.    aspirin EC 81 MG tablet Take 81 mg by mouth daily.   B Complex-C (B-COMPLEX WITH VITAMIN C) tablet Take 1 tablet by mouth daily with lunch.   clopidogrel (PLAVIX) 75 MG tablet TAKE 1 TABLET (75 MG) BY MOUTH DAILY   Coenzyme Q10 (CO Q 10) 100 MG CAPS Take 200 mg by mouth daily with lunch.   DHEA 25 MG CAPS Take 25 mg by mouth daily with lunch.    fish oil-omega-3 fatty acids 1000 MG capsule Take 1 g by mouth at bedtime.    Garlic 1000 MG CAPS Take 2,000 mg by mouth daily with lunch.   ibuprofen (ADVIL,MOTRIN) 200 MG tablet Take 400-600 mg by mouth every 8 (eight)  hours as needed (for pain.).   lisinopril (ZESTRIL) 5 MG tablet TAKE 1 TABLET BY MOUTH EVERY DAY   metFORMIN (GLUCOPHAGE-XR) 500 MG 24 hr tablet SMARTSIG:2 Tablet(s) By Mouth Every Evening   metoprolol succinate (TOPROL-XL) 25 MG 24 hr tablet TAKE 1 TABLET BY MOUTH DAILY   Multiple Vitamin (MULTIVITAMIN WITH MINERALS) TABS Take 1 tablet by mouth daily with lunch.    saw palmetto 160 MG capsule Take 160 mg by mouth daily with lunch.    Selenium 100 MCG TABS Take 1 tablet by mouth daily.   simvastatin (ZOCOR) 20 MG tablet TAKE 1 TABLET BY MOUTH EVERYDAY AT BEDTIME     No Known Allergies  Social History   Socioeconomic History   Marital status: Single    Spouse name: Not on file   Number of children: 1   Years of education: Not on file   Highest education level: Not on file  Occupational History   Not on file  Tobacco Use   Smoking status: Former    Types: Cigarettes    Quit date: 12/27/1973    Years since quitting: 48.9   Smokeless tobacco: Never  Substance and Sexual Activity   Alcohol use: Yes    Comment: beer  every once in while   Drug use: No   Sexual activity: Not on file  Other Topics Concern   Not on file  Social History Narrative   Not on file   Social Determinants of Health   Financial Resource Strain: Not on file  Food Insecurity: Not on file  Transportation Needs: Not on file  Physical Activity: Not on file  Stress: Not on file  Social Connections: Not on file  Intimate Partner Violence: Not on file     Review of Systems: General: negative for chills, fever, night sweats or weight changes.  Cardiovascular: negative for chest pain, dyspnea on exertion, edema, orthopnea, palpitations, paroxysmal nocturnal dyspnea or shortness of breath Dermatological: negative for rash Respiratory: negative for cough or wheezing Urologic: negative for hematuria Abdominal: negative for nausea, vomiting, diarrhea, bright red blood per rectum, melena, or  hematemesis Neurologic: negative for visual changes, syncope, or dizziness All other systems reviewed and are otherwise negative except as noted above.    Blood pressure (!) 146/72, pulse 62, height 5\' 9"  (1.753 m), weight 262 lb 12.8 oz (119.2 kg), SpO2 96 %.  General appearance: alert and no distress Neck: no adenopathy, no carotid bruit, no JVD, supple, symmetrical, trachea midline, and thyroid not enlarged, symmetric, no tenderness/mass/nodules Lungs: clear to auscultation bilaterally Heart: regular rate and rhythm, S1, S2 normal, no murmur, click, rub or gallop Extremities: extremities normal, atraumatic, no cyanosis or edema Pulses: 2+ and symmetric Skin: Skin color, texture, turgor normal. No rashes or lesions Neurologic: Grossly normal  EKG sinus rhythm at 62 with septal Q waves.  I personally reviewed this EKG.  ASSESSMENT AND PLAN:   Coronary artery disease History of CAD status post mid LAD CTO intervention by myself 06/14/2011 subsequent Myoview stress test have all been negative for ischemia.  He is totally asymptomatic.  Essential hypertension History of essential hypertension a blood pressure measured today at 146/72.  He is on lisinopril and Toprol.  Hyperlipidemia History of hyperlipidemia on statin therapy with lipid profile performed 3 weeks ago revealing total cholesterol 108, LDL 48 and HDL of 32.     06/16/2011 MD [facility], [facility] 11/30/2022 10:00 AM

## 2022-11-30 NOTE — Assessment & Plan Note (Signed)
History of hyperlipidemia on statin therapy with lipid profile performed 3 weeks ago revealing total cholesterol 108, LDL 48 and HDL of 32.

## 2022-11-30 NOTE — Assessment & Plan Note (Signed)
History of essential hypertension a blood pressure measured today at 146/72.  He is on lisinopril and Toprol.

## 2022-11-30 NOTE — Patient Instructions (Signed)
Medication Instructions:  Your physician recommends that you continue on your current medications as directed. Please refer to the Current Medication list given to you today.  *If you need a refill on your cardiac medications before your next appointment, please call your pharmacy*   Follow-Up: At [facility], you and your health needs are our priority.  As part of our continuing mission to provide you with exceptional heart care, we have created designated Provider Care Teams.  These Care Teams include your primary Cardiologist (physician) and Advanced Practice Providers (APPs -  Physician Assistants and Nurse Practitioners) who all work together to provide you with the care you need, when you need it.  We recommend signing up for the patient portal called "MyChart".  Sign up information is provided on this After Visit Summary.  MyChart is used to connect with patients for Virtual Visits (Telemedicine).  Patients are able to view lab/test results, encounter notes, upcoming appointments, etc.  Non-urgent messages can be sent to your provider as well.   To learn more about what you can do with MyChart, go to [email or web address].    Your next appointment:   12 month(s)  The format for your next appointment:   In Person  Provider:   Jonathan Berry, MD   

## 2022-11-30 NOTE — Assessment & Plan Note (Signed)
History of CAD status post mid LAD CTO intervention by myself 06/14/2011 subsequent Myoview stress test have all been negative for ischemia.  He is totally asymptomatic.

## 2023-11-11 ENCOUNTER — Other Ambulatory Visit: Payer: Self-pay | Admitting: Cardiovascular Disease

## 2023-11-15 ENCOUNTER — Other Ambulatory Visit: Payer: Self-pay | Admitting: Cardiovascular Disease

## 2023-11-16 ENCOUNTER — Telehealth: Payer: Self-pay | Admitting: Cardiovascular Disease

## 2023-11-16 MED ORDER — METOPROLOL SUCCINATE ER 25 MG PO TB24: ORAL_TABLET | ORAL | 0 refills | Status: DC

## 2023-11-16 MED ORDER — SIMVASTATIN 20 MG PO TABS: 20.0000 mg | ORAL_TABLET | Freq: Every day | ORAL | 3 refills | Status: DC

## 2023-11-16 MED ORDER — CLOPIDOGREL BISULFATE 75 MG PO TABS: ORAL_TABLET | ORAL | 0 refills | Status: DC

## 2023-11-16 NOTE — Telephone Encounter (Signed)
Refills have been faxed to the patient's pharmacy.

## 2023-11-16 NOTE — Telephone Encounter (Signed)
*  STAT* If patient is at the pharmacy, call can be transferred to refill team.   1. Which medications need to be refilled? (please list name of each medication and dose if known)   clopidogrel (PLAVIX) 75 MG tablet    metoprolol succinate (TOPROL-XL) 25 MG 24 hr tablet  simvastatin (ZOCOR) 20 MG tablet    2. Would you like to learn more about the convenience, safety, & potential cost savings by using the [facility]? No   3. Are you open to using the [facility] (Type [facility].) No   4. Which pharmacy/location (including street and city if local pharmacy) is medication to be sent to? [id number] - [place], [place] - [address]    5. Do they need a 30 day or 90 day supply? 90 day

## 2023-12-05 ENCOUNTER — Ambulatory Visit: Payer: Medicare Other | Attending: Cardiovascular Disease | Admitting: Cardiovascular Disease

## 2023-12-05 ENCOUNTER — Encounter: Payer: Self-pay | Admitting: Cardiovascular Disease

## 2023-12-05 ENCOUNTER — Other Ambulatory Visit: Payer: Self-pay

## 2023-12-05 VITALS — BP 152/70 | HR 67 | Ht 70.0 in | Wt 258.2 lb

## 2023-12-05 DIAGNOSIS — E782 Mixed hyperlipidemia: Secondary | ICD-10-CM | POA: Insufficient documentation

## 2023-12-05 DIAGNOSIS — I2510 Atherosclerotic heart disease of native coronary artery without angina pectoris: Secondary | ICD-10-CM | POA: Diagnosis present

## 2023-12-05 DIAGNOSIS — I10 Essential (primary) hypertension: Secondary | ICD-10-CM | POA: Insufficient documentation

## 2023-12-05 MED ORDER — SIMVASTATIN 20 MG PO TABS: 20.0000 mg | ORAL_TABLET | Freq: Every day | ORAL | 3 refills | Status: DC

## 2023-12-05 MED ORDER — CLOPIDOGREL BISULFATE 75 MG PO TABS: ORAL_TABLET | ORAL | 3 refills | Status: DC

## 2023-12-05 MED ORDER — LISINOPRIL 5 MG PO TABS: ORAL_TABLET | ORAL | 3 refills | Status: DC

## 2023-12-05 MED ORDER — METOPROLOL SUCCINATE ER 25 MG PO TB24: ORAL_TABLET | ORAL | 3 refills | Status: DC

## 2023-12-05 NOTE — Patient Instructions (Signed)
Medication Instructions:  Your physician recommends that you continue on your current medications as directed. Please refer to the Current Medication list given to you today.  *If you need a refill on your cardiac medications before your next appointment, please call your pharmacy*   Testing/Procedures: Your physician has requested that you have an ankle brachial index (ABI). During this test an ultrasound and blood pressure cuff are used to evaluate the arteries that supply the arms and legs with blood. Allow thirty minutes for this exam. There are no restrictions or special instructions. This will take place at [address] [facility], [address].    Please note: We ask at that you not bring children with you during ultrasound (echo/ vascular) testing. Due to room size and safety concerns, children are not allowed in the ultrasound rooms during exams. Our front office staff cannot provide observation of children in our lobby area while testing is being conducted. An adult accompanying a patient to their appointment will only be allowed in the ultrasound room at the discretion of the ultrasound technician under special circumstances. We apologize for any inconvenience.    Follow-Up: At [facility], you and your health needs are our priority.  As part of our continuing mission to provide you with exceptional heart care, we have created designated Provider Care Teams.  These Care Teams include your primary Cardiologist (physician) and Advanced Practice Providers (APPs -  Physician Assistants and Nurse Practitioners) who all work together to provide you with the care you need, when you need it.  We recommend signing up for the patient portal called "MyChart".  Sign up information is provided on this After Visit Summary.  MyChart is used to connect with patients for Virtual Visits (Telemedicine).  Patients are able to view lab/test results, encounter notes, upcoming appointments, etc.  Non-urgent  messages can be sent to your provider as well.   To learn more about what you can do with MyChart, go to [email or web address].    Your next appointment:   12 month(s)  Provider:   Nanetta Batty, MD

## 2023-12-05 NOTE — Assessment & Plan Note (Signed)
History of CAD status post LAD CTO intervention by myself 06/14/2011 with subsequent negative Myoview stress test.  He is totally asymptomatic.

## 2023-12-05 NOTE — Assessment & Plan Note (Signed)
History of hyperlipidemia on statin therapy with lipid profile performed by his PCP 11/10/2023 revealing a total cholesterol 128, LDL 69 HDL 35.

## 2023-12-05 NOTE — Assessment & Plan Note (Signed)
History of essential hypertension blood pressure measured today at 152/70.  He is on lisinopril and metoprolol.

## 2023-12-05 NOTE — Progress Notes (Signed)
12/05/2023 [name]   [birth year]  [id number]  Primary Physician Marisue Ivan, MD Primary Cardiologist: Runell Gess MD FACP, [place], [place], [place]  HPI:  Darrell Jennings is a 77 y.o.  moderately overweight divorced Caucasian male, father of 1, grandfather to 3 grandchildren, who I last saw in the office 11/30/2022. He was initially referred to me by Dr. Mariel Kansky at [facility] for treatment of an LAD CTO, which I successfully opened with a Resolute drug-eluting stent. Since that time, he has seen me back in followup. His other problems include hypertension and hyperlipidemia. His symptoms resolved after this intervention, and his Myoview performed November 02, 2011, showed no ischemia. he had a Myoview performed prior to extensive sinus surgery for preoperative clearance 11/07/13 which was normal. He does complain of occasional substernal chest pain with left upper extremity radiation. Marland Kitchen   He underwent elective total left total hip replacement in [facility] 11/09/16 and recuperated nicely. I did perform a Myoview stress test on him 10/14/16 for preoperative clearance and this was entirely normal.    Since I saw him a year ago he is remained stable.  He still quite active.  He denies chest pain or shortness of breath.   Current Meds  Medication Sig   arginine 500 MG tablet Take 1,000 mg by mouth daily with lunch.    aspirin EC 81 MG tablet Take 81 mg by mouth daily.   B Complex-C (B-COMPLEX WITH VITAMIN C) tablet Take 1 tablet by mouth daily with lunch.   clopidogrel (PLAVIX) 75 MG tablet TAKE 1 TABLET (75 MG) BY MOUTH DAILY. Please keep scheduled appointment for future refills. Thank you.   Coenzyme Q10 (CO Q 10) 100 MG CAPS Take 200 mg by mouth daily with lunch.   DHEA 25 MG CAPS Take 25 mg by mouth daily with lunch.    fish oil-omega-3 fatty acids 1000 MG capsule Take 1 g by mouth at bedtime.    Garlic 1000 MG CAPS Take 2,000 mg by mouth daily with lunch.   ibuprofen  (ADVIL,MOTRIN) 200 MG tablet Take 400-600 mg by mouth every 8 (eight) hours as needed (for pain.).   lisinopril (ZESTRIL) 5 MG tablet TAKE 1 TABLET BY MOUTH EVERY DAY   metFORMIN (GLUCOPHAGE-XR) 500 MG 24 hr tablet SMARTSIG:2 Tablet(s) By Mouth Every Evening   metoprolol succinate (TOPROL-XL) 25 MG 24 hr tablet TAKE 1 TABLET BY MOUTH DAILY. Please keep scheduled appointment for future refills. Thank you.   Multiple Vitamin (MULTIVITAMIN WITH MINERALS) TABS Take 1 tablet by mouth daily with lunch.    oxyCODONE (OXY IR/ROXICODONE) 5 MG immediate release tablet Take 1-2 tablets (5-10 mg total) by mouth every 4 (four) hours as needed for breakthrough pain.   saw palmetto 160 MG capsule Take 160 mg by mouth daily with lunch.    Selenium 100 MCG TABS Take 1 tablet by mouth daily.   simvastatin (ZOCOR) 20 MG tablet Take 1 tablet (20 mg total) by mouth at bedtime. Please keep scheduled appointment for future refills. Thank you.     No Known Allergies  Social History   Socioeconomic History   Marital status: Single    Spouse name: Not on file   Number of children: 1   Years of education: Not on file   Highest education level: Not on file  Occupational History   Not on file  Tobacco Use   Smoking status: Former    Current packs/day: 0.00  Types: Cigarettes    Quit date: 12/27/1973    Years since quitting: 49.9   Smokeless tobacco: Never  Substance and Sexual Activity   Alcohol use: Yes    Comment: beer  every once in while   Drug use: No   Sexual activity: Not on file  Other Topics Concern   Not on file  Social History Narrative   Not on file   Social Determinants of Health   Financial Resource Strain: Low Risk  (11/13/2023)   Received from [facility]   Overall Financial Resource Strain (CARDIA)    Difficulty of Paying Living Expenses: Not very hard  Food Insecurity: No Food Insecurity (11/13/2023)   Received from [facility]   Hunger Vital  Sign    Worried About Running Out of Food in the Last Year: Never true    Ran Out of Food in the Last Year: Never true  Transportation Needs: No Transportation Needs (11/13/2023)   Received from [facility]    In the past 12 months, has lack of transportation kept you from medical appointments or from getting medications?: No    Lack of Transportation (Non-Medical): No  Physical Activity: Not on file  Stress: Not on file  Social Connections: Not on file  Intimate Partner Violence: Not on file     Review of Systems: General: negative for chills, fever, night sweats or weight changes.  Cardiovascular: negative for chest pain, dyspnea on exertion, edema, orthopnea, palpitations, paroxysmal nocturnal dyspnea or shortness of breath Dermatological: negative for rash Respiratory: negative for cough or wheezing Urologic: negative for hematuria Abdominal: negative for nausea, vomiting, diarrhea, bright red blood per rectum, melena, or hematemesis Neurologic: negative for visual changes, syncope, or dizziness All other systems reviewed and are otherwise negative except as noted above.    Blood pressure (!) 152/70, pulse 67, height 5\' 10"  (1.778 m), weight 258 lb 3.2 oz (117.1 kg), SpO2 96%.  General appearance: alert and no distress Neck: no adenopathy, no carotid bruit, no JVD, supple, symmetrical, trachea midline, and thyroid not enlarged, symmetric, no tenderness/mass/nodules Lungs: clear to auscultation bilaterally Heart: regular rate and rhythm, S1, S2 normal, no murmur, click, rub or gallop Extremities: extremities normal, atraumatic, no cyanosis or edema Pulses: 2+ and symmetric Skin: Skin color, texture, turgor normal. No rashes or lesions Neurologic: Grossly normal  EKG EKG Interpretation Date/Time:  Monday December 05 2023 11:18:05 EST Ventricular Rate:  66 PR Interval:  176 QRS Duration:  92 QT Interval:  388 QTC  Calculation: 406 R Axis:   47  Text Interpretation: Normal sinus rhythm Low voltage QRS Septal infarct (cited on or before 14-Jun-2011) When compared with ECG of 15-Jun-2011 01:20, Nonspecific T wave abnormality no longer evident in Anterolateral leads Confirmed by Nanetta Batty [phone]) on 12/05/2023 11:35:45 AM    ASSESSMENT AND PLAN:   Essential hypertension History of essential hypertension blood pressure measured today at 152/70.  He is on lisinopril and metoprolol.  Coronary artery disease History of CAD status post LAD CTO intervention by myself 06/14/2011 with subsequent negative Myoview stress test.  He is totally asymptomatic.  Hyperlipidemia History of hyperlipidemia on statin therapy with lipid profile performed by his PCP 11/10/2023 revealing a total cholesterol 128, LDL 69 HDL 35.     Runell Gess MD [facility], [facility] 12/05/2023 11:43 AM

## 2023-12-06 ENCOUNTER — Other Ambulatory Visit: Payer: Self-pay | Admitting: Cardiovascular Disease

## 2023-12-06 ENCOUNTER — Ambulatory Visit (HOSPITAL_COMMUNITY)
Admission: RE | Admit: 2023-12-06 | Discharge: 2023-12-06 | Disposition: A | Discharge status: Home / Self Care | Payer: Medicare Other | Source: Ambulatory Visit | Attending: Cardiovascular Disease | Admitting: Cardiovascular Disease

## 2023-12-06 DIAGNOSIS — M79604 Pain in right leg: Secondary | ICD-10-CM | POA: Insufficient documentation

## 2023-12-06 DIAGNOSIS — I2510 Atherosclerotic heart disease of native coronary artery without angina pectoris: Secondary | ICD-10-CM

## 2023-12-06 DIAGNOSIS — I10 Essential (primary) hypertension: Secondary | ICD-10-CM

## 2023-12-06 DIAGNOSIS — M79605 Pain in left leg: Secondary | ICD-10-CM | POA: Diagnosis not present

## 2023-12-06 DIAGNOSIS — E782 Mixed hyperlipidemia: Secondary | ICD-10-CM

## 2023-12-09 LAB — VAS US ABI WITH/WO TBI
Left ABI: 1.17
Right ABI: 1.05

## 2023-12-14 ENCOUNTER — Other Ambulatory Visit: Payer: Self-pay | Admitting: Cardiovascular Disease

## 2024-01-31 ENCOUNTER — Ambulatory Visit
Admission: EM | Admit: 2024-01-31 | Discharge: 2024-01-31 | Disposition: A | Discharge status: Home / Self Care | Payer: Medicare Other | Attending: Emergency Medicine | Admitting: Emergency Medicine

## 2024-01-31 DIAGNOSIS — J069 Acute upper respiratory infection, unspecified: Secondary | ICD-10-CM | POA: Diagnosis not present

## 2024-01-31 DIAGNOSIS — Z20822 Contact with and (suspected) exposure to covid-19: Secondary | ICD-10-CM | POA: Diagnosis not present

## 2024-01-31 LAB — RESP PANEL BY RT-PCR (FLU A&B, COVID) ARPGX2
Influenza A by PCR: NEGATIVE
Influenza B by PCR: NEGATIVE
SARS Coronavirus 2 by RT PCR: NEGATIVE

## 2024-01-31 MED ORDER — PROMETHAZINE-DM 6.25-15 MG/5ML PO SYRP: 5.0000 mL | ORAL_SOLUTION | Freq: Four times a day (QID) | ORAL | 0 refills | Status: DC | PRN

## 2024-01-31 MED ORDER — IPRATROPIUM BROMIDE 0.06 % NA SOLN: 2.0000 | Freq: Four times a day (QID) | NASAL | 0 refills | Status: DC

## 2024-01-31 NOTE — Discharge Instructions (Signed)
 Your COVID and influenza testing are negative.  Claritin, Allegra or Zyrtec, since the Mucinex is not helping.  Atrovent  nasal spray will dry up the nasal congestion and stop the postnasal drip.  Promethazine  DM as needed for cough.  Most sinus infections are viral and do not need antibiotics unless you have a high fever, have had this for 10 days, or you get better and then get sick again. Use a NeilMed sinus rinse with distilled water as often as you want to to reduce nasal congestion. Follow the directions on the box.   Go to www.goodrx.com to look up your medications. This will give you a list of where you can find your prescriptions at the most affordable prices. Or you can ask the pharmacist what the cash price is. This is frequently cheaper than going through insurance.

## 2024-01-31 NOTE — ED Triage Notes (Signed)
Sx 4-5 days  Runny nose Productive cough, clear mucus Watery eyes

## 2024-01-31 NOTE — ED Provider Notes (Signed)
 HPI  SUBJECTIVE:  Darrell Jennings is a 78 y.o. male who presents with 4 to 5 days of nasal congestion, clear rhinorrhea, cough productive of clear sputum, malaise, headaches, sinus pain and pressure, postnasal drip.  He reports watery eyes and sneezing.  No fevers, body aches, facial swelling, upper dental pain, sore throat, wheezing, chest pain, shortness of breath, nausea, vomiting, diarrhea, abdominal pain, double sickening.  No known COVID or flu exposure.  He got the COVID, flu, RSV vaccines.  He is unable to sleep at night because of the cough.  No antipyretic in the past 6 hours.  No antibiotics in the past 3 months.  He has tried multiple variations of Mucinex without improvement in symptoms and has been taking ibuprofen with improvement in his headache.  No aggravating factors.  He quit smoking 30 years ago, has a history of coronary artery disease, angina, hypercholesterolemia, hyperlipidemia, hypertension, diet-controlled diabetes.  [facility]  Past Medical History:  Diagnosis Date   Anginal pain (HCC)    Arthritis    BPH (benign prostatic hyperplasia)    Coronary artery disease    Diabetes mellitus without complication (HCC)    controlled by diet, no medication   History of nuclear stress test 11/02/2011   bruce myoview ; no ischemia   Hypercholesteremia    Hyperlipidemia    Hypertension    Obesity     Past Surgical History:  Procedure Laterality Date   ANKLE FRACTURE SURGERY Left    ANKLE SYNDESMOTIC SCREW REMOVAL   COLONOSCOPY WITH PROPOFOL  N/A 06/16/2015   Procedure: COLONOSCOPY WITH PROPOFOL ;  Surgeon: Gladis RAYMOND Mariner, MD;  Location: [facility] ENDOSCOPY;  Service: Endoscopy;  Laterality: N/A;   CORONARY ANGIOPLASTY WITH STENT PLACEMENT  06/14/2011   2.5x66mm Resolute stent of LAD (total occlusion to 0%)- Dr. DOROTHA Lesches)   HEMORRHOID SURGERY     LEG SURGERY  2010   broke leg   TOTAL HIP ARTHROPLASTY Left 11/09/2016   Procedure: TOTAL HIP ARTHROPLASTY ANTERIOR  APPROACH;  Surgeon: Ozell Flake, MD;  Location: [facility] ORS;  Service: Orthopedics;  Laterality: Left;    Family History  Problem Relation Age of Onset   Heart attack Mother 22       also DM   Heart attack Father 76   Heart attack Sister        half   Diabetes Brother     Social History   Tobacco Use   Smoking status: Former    Current packs/day: 0.00    Types: Cigarettes    Quit date: 12/27/1973    Years since quitting: 50.1    Passive exposure: Never   Smokeless tobacco: Never  Vaping Use   Vaping status: Never Used  Substance Use Topics   Alcohol use: Yes    Comment: beer  every once in while   Drug use: No    No current facility-administered medications for this encounter.  Current Outpatient Medications:    aspirin  EC 81 MG tablet, Take 81 mg by mouth daily., Disp: , Rfl:    B Complex-C (B-COMPLEX WITH VITAMIN C) tablet, Take 1 tablet by mouth daily with lunch., Disp: , Rfl:    clopidogrel  (PLAVIX ) 75 MG tablet, TAKE 1 TABLET (75 MG) BY MOUTH DAILY. Please keep scheduled appointment for future refills. Thank you., Disp: 90 tablet, Rfl: 3   Coenzyme Q10 (CO Q 10) 100 MG CAPS, Take 200 mg by mouth daily with lunch., Disp: 90 capsule, Rfl: 3   DHEA 25 MG  CAPS, Take 25 mg by mouth daily with lunch. , Disp: , Rfl:    fish oil-omega-3 fatty acids 1000 MG capsule, Take 1 g by mouth at bedtime. , Disp: , Rfl:    Garlic 1000 MG CAPS, Take 2,000 mg by mouth daily with lunch., Disp: , Rfl:    ibuprofen (ADVIL,MOTRIN) 200 MG tablet, Take 400-600 mg by mouth every 8 (eight) hours as needed (for pain.)., Disp: , Rfl:    ipratropium (ATROVENT ) 0.06 % nasal spray, Place 2 sprays into both nostrils 4 (four) times daily., Disp: 15 mL, Rfl: 0   lisinopril  (ZESTRIL ) 5 MG tablet, TAKE 1 TABLET BY MOUTH EVERY DAY, Disp: 90 tablet, Rfl: 3   metFORMIN (GLUCOPHAGE-XR) 500 MG 24 hr tablet, SMARTSIG:2 Tablet(s) By Mouth Every Evening, Disp: , Rfl:    metoprolol  succinate (TOPROL -XL) 25 MG 24 hr  tablet, TAKE 1 TABLET BY MOUTH DAILY. Please keep scheduled appointment for future refills. Thank you., Disp: 90 tablet, Rfl: 3   promethazine -dextromethorphan (PROMETHAZINE -DM) 6.25-15 MG/5ML syrup, Take 5 mLs by mouth 4 (four) times daily as needed for cough., Disp: 118 mL, Rfl: 0   saw palmetto  160 MG capsule, Take 160 mg by mouth daily with lunch. , Disp: , Rfl:    Selenium  100 MCG TABS, Take 1 tablet by mouth daily., Disp: , Rfl:    simvastatin  (ZOCOR ) 20 MG tablet, Take 1 tablet (20 mg total) by mouth at bedtime. Please keep scheduled appointment for future refills. Thank you., Disp: 90 tablet, Rfl: 3   arginine  500 MG tablet, Take 1,000 mg by mouth daily with lunch. , Disp: , Rfl:    Multiple Vitamin (MULTIVITAMIN WITH MINERALS) TABS, Take 1 tablet by mouth daily with lunch. , Disp: , Rfl:   No Known Allergies   ROS  As noted in HPI.   Physical Exam  BP (!) 152/74 (BP Location: Left Arm)   Pulse 86   Temp 98.2 F (36.8 C) (Oral)   Resp 16   SpO2 96%   Constitutional: Well developed, well nourished, no acute distress Eyes:  EOMI, conjunctiva normal bilaterally HENT: Normocephalic, atraumatic,mucus membranes moist.  Erythematous, not swollen turbinates.  Clear nasal congestion.  No maxillary, frontal sinus tenderness.  No postnasal drip. Respiratory: Normal inspiratory effort, lungs clear bilaterally.  No anterior, lateral chest wall tenderness Cardiovascular: Normal rate, regular rhythm, no murmurs rubs or gallops GI: nondistended skin: No rash, skin intact Musculoskeletal: no deformities Neurologic: Alert & oriented x 3, no focal neuro deficits Psychiatric: Speech and behavior appropriate   ED Course   Medications - No data to display  Orders Placed This Encounter  Procedures   Resp Panel by RT-PCR (Flu A&B, Covid) Anterior Nasal Swab    Standing Status:   Standing    Number of Occurrences:   1    Patient immune status:   Normal    Release to patient:   Immediate  [1]    Results for orders placed or performed during the hospital encounter of 01/31/24 (from the past 24 hours)  Resp Panel by RT-PCR (Flu A&B, Covid) Anterior Nasal Swab     Status: None   Collection Time: 01/31/24  9:05 AM   Specimen: Anterior Nasal Swab  Result Value Ref Range   SARS Coronavirus 2 by RT PCR NEGATIVE NEGATIVE   Influenza A by PCR NEGATIVE NEGATIVE   Influenza B by PCR NEGATIVE NEGATIVE   No results found.  ED Clinical Impression  1. Upper respiratory tract infection, unspecified  type   2. Lab test negative for COVID-19 virus      ED Assessment/Plan     COVID, flu negative.  I offered to order an x-ray for the patient to rule out pneumonia, but he states that his symptoms are all in my sinuses and would like to defer that today.  I think this is reasonable.  Home with Atrovent  nasal spray, saline nasal irrigation, Promethazine  DM.  Will try Zyrtec since the Mucinex has not been helping.  Follow-up with PCP or return here as needed.  Discussed labs,  MDM, treatment plan, and plan for follow-up with patient.patient agrees with plan.   Meds ordered this encounter  Medications   ipratropium (ATROVENT ) 0.06 % nasal spray    Sig: Place 2 sprays into both nostrils 4 (four) times daily.    Dispense:  15 mL    Refill:  0   promethazine -dextromethorphan (PROMETHAZINE -DM) 6.25-15 MG/5ML syrup    Sig: Take 5 mLs by mouth 4 (four) times daily as needed for cough.    Dispense:  118 mL    Refill:  0      *This clinic note was created using Scientist, clinical (histocompatibility and immunogenetics). Therefore, there may be occasional mistakes despite careful proofreading.  ?    Van Knee, MD 02/01/24 1431

## 2024-08-14 ENCOUNTER — Ambulatory Visit
Admission: EM | Admit: 2024-08-14 | Discharge: 2024-08-14 | Disposition: A | Discharge status: Home / Self Care | Attending: Emergency Medicine | Admitting: Emergency Medicine

## 2024-08-14 ENCOUNTER — Encounter: Payer: Self-pay | Admitting: Emergency Medicine

## 2024-08-14 DIAGNOSIS — E7800 Pure hypercholesterolemia, unspecified: Secondary | ICD-10-CM | POA: Insufficient documentation

## 2024-08-14 DIAGNOSIS — N400 Enlarged prostate without lower urinary tract symptoms: Secondary | ICD-10-CM | POA: Insufficient documentation

## 2024-08-14 DIAGNOSIS — N410 Acute prostatitis: Secondary | ICD-10-CM | POA: Insufficient documentation

## 2024-08-14 DIAGNOSIS — E6609 Other obesity due to excess calories: Secondary | ICD-10-CM | POA: Insufficient documentation

## 2024-08-14 DIAGNOSIS — E1169 Type 2 diabetes mellitus with other specified complication: Secondary | ICD-10-CM | POA: Insufficient documentation

## 2024-08-14 LAB — URINALYSIS, W/ REFLEX TO CULTURE (INFECTION SUSPECTED)
Glucose, UA: NEGATIVE mg/dL
Leukocytes,Ua: NEGATIVE
Nitrite: NEGATIVE
Protein, ur: 30 mg/dL — AB
Specific Gravity, Urine: >1.03 — ABNORMAL HIGH (ref 1.005–1.030)
pH: 5.5 (ref 5.0–8.0)

## 2024-08-14 MED ORDER — CIPROFLOXACIN HCL 500 MG PO TABS: 500.0000 mg | ORAL_TABLET | Freq: Two times a day (BID) | ORAL | 0 refills | Status: AC

## 2024-08-14 MED ORDER — PHENAZOPYRIDINE HCL 200 MG PO TABS: 200.0000 mg | ORAL_TABLET | Freq: Three times a day (TID) | ORAL | 0 refills | Status: AC

## 2024-08-14 NOTE — Discharge Instructions (Addendum)
 Your urinalysis showed a significant number of white blood cells and red blood cells along with many bacteria but not the typical byproducts associated with a urinary tract infection.  I am suspicious she may actually be experiencing prostatitis.  Your urine is being sent for culture to see what bacteria grows out.  Take the Cipro  500 mg twice daily with food for the next 14 days for treatment of prostatitis.  You may use the Pyridium  every 8 hours as needed for urinary discomfort.  It will turn your urine to very deep red to orange.  If we need to make any adjustments to your antibiotics based upon your culture results we will contact you by phone.  If the Cipro  will treat the infection that is present your results will appear in your MyChart.  If you do not have any improvement of your symptoms either return for reevaluation with your PCP.

## 2024-08-14 NOTE — ED Triage Notes (Signed)
 Pt c/o urinary freq & lower back pain x5 days. Hx of BPH. Denies any hematuria. Has tried IBU w/o relief.

## 2024-08-14 NOTE — ED Provider Notes (Signed)
 [facility]    CSN: [id number] Arrival date & time: 08/14/24  1341      History   Chief Complaint Chief Complaint  Patient presents with   Urinary Frequency   Back Pain    HPI Darrell Jennings is a 78 y.o. male.   HPI  78 year old male with past medical history significant for CAD, essential hypertension, hyperlipidemia, osteoarthritis of the left hip, BPH, and type 2 diabetes presents for evaluation of urinary complaints that started 5 days ago.  These include some dysuria along with urinary urgency and frequency, and low back pain.  He denies any blood in his urine or fever.  He does have a history of UTIs.  Past Medical History:  Diagnosis Date   Anginal pain (HCC)    Arthritis    BPH (benign prostatic hyperplasia)    Coronary artery disease    Diabetes mellitus without complication (HCC)    controlled by diet, no medication   History of nuclear stress test 11/02/2011   bruce myoview ; no ischemia   Hypercholesteremia    Hyperlipidemia    Hypertension    Obesity     Patient Active Problem List   Diagnosis Date Noted   BPH (benign prostatic hyperplasia) 08/14/2024   Non morbid obesity due to excess calories 08/14/2024   Pure hypercholesterolemia 08/14/2024   Type 2 diabetes mellitus with hyperlipidemia (HCC) 08/14/2024   Routine general medical examination at a health care facility 05/06/2017   Aftercare following joint replacement 11/25/2016   Primary osteoarthritis of left hip 11/09/2016   Coronary artery disease 10/26/2013   Essential hypertension 10/26/2013   Hyperlipidemia 10/26/2013    Past Surgical History:  Procedure Laterality Date   ANKLE FRACTURE SURGERY Left    ANKLE SYNDESMOTIC SCREW REMOVAL   COLONOSCOPY WITH PROPOFOL  N/A 06/16/2015   Procedure: COLONOSCOPY WITH PROPOFOL ;  Surgeon: Gladis RAYMOND Mariner, MD;  Location: [facility] ENDOSCOPY;  Service: Endoscopy;  Laterality: N/A;   CORONARY ANGIOPLASTY WITH STENT PLACEMENT  06/14/2011   2.5x32mm  Resolute stent of LAD (total occlusion to 0%)- Dr. DOROTHA Lesches)   HEMORRHOID SURGERY     LEG SURGERY  2010   broke leg   TOTAL HIP ARTHROPLASTY Left 11/09/2016   Procedure: TOTAL HIP ARTHROPLASTY ANTERIOR APPROACH;  Surgeon: Ozell Flake, MD;  Location: [facility] ORS;  Service: Orthopedics;  Laterality: Left;       Home Medications    Prior to Admission medications   Medication Sig Start Date End Date Taking? Authorizing Provider  ciprofloxacin  (CIPRO ) 500 MG tablet Take 1 tablet (500 mg total) by mouth 2 (two) times daily for 14 days. 08/14/24 08/28/24 Yes Bernardino Ditch, NP  phenazopyridine  (PYRIDIUM ) 200 MG tablet Take 1 tablet (200 mg total) by mouth 3 (three) times daily. 08/14/24  Yes Bernardino Ditch, NP  arginine  500 MG tablet Take 1,000 mg by mouth daily with lunch.     [provider]  aspirin  EC 81 MG tablet Take 81 mg by mouth daily.    [provider]  B Complex-C (B-COMPLEX WITH VITAMIN C) tablet Take 1 tablet by mouth daily with lunch.    [provider]  clopidogrel  (PLAVIX ) 75 MG tablet TAKE 1 TABLET (75 MG) BY MOUTH DAILY. Please keep scheduled appointment for future refills. Thank you. 12/05/23   Lesches Dorn PARAS, MD  Coenzyme Q10 (CO Q 10) 100 MG CAPS Take 200 mg by mouth daily with lunch. 12/13/18   Lesches Dorn PARAS, MD  DHEA 25 MG CAPS  Take 25 mg by mouth daily with lunch.     [provider]  fish oil-omega-3 fatty acids 1000 MG capsule Take 1 g by mouth at bedtime.     [provider]  Garlic 1000 MG CAPS Take 2,000 mg by mouth daily with lunch.    [provider]  ibuprofen (ADVIL,MOTRIN) 200 MG tablet Take 400-600 mg by mouth every 8 (eight) hours as needed (for pain.).    [provider]  lisinopril  (ZESTRIL ) 5 MG tablet TAKE 1 TABLET BY MOUTH EVERY DAY 12/05/23   Court Dorn PARAS, MD  metFORMIN (GLUCOPHAGE-XR) 500 MG 24 hr tablet SMARTSIG:2 Tablet(s) By Mouth Every Evening 11/16/21   [provider]   metoprolol  succinate (TOPROL -XL) 25 MG 24 hr tablet TAKE 1 TABLET BY MOUTH DAILY. Please keep scheduled appointment for future refills. Thank you. 12/05/23   Court Dorn PARAS, MD  Multiple Vitamin (MULTIVITAMIN WITH MINERALS) TABS Take 1 tablet by mouth daily with lunch.     [provider]  saw palmetto  160 MG capsule Take 160 mg by mouth daily with lunch.     [provider]  Selenium  100 MCG TABS Take 1 tablet by mouth daily.    [provider]  simvastatin  (ZOCOR ) 20 MG tablet Take 1 tablet (20 mg total) by mouth at bedtime. Please keep scheduled appointment for future refills. Thank you. 12/05/23   Court Dorn PARAS, MD    Family History Family History  Problem Relation Age of Onset   Heart attack Mother 28       also DM   Heart attack Father 59   Heart attack Sister        half   Diabetes Brother     Social History Social History   Tobacco Use   Smoking status: Former    Current packs/day: 0.00    Types: Cigarettes    Quit date: 12/27/1973    Years since quitting: 50.6    Passive exposure: Never   Smokeless tobacco: Never  Vaping Use   Vaping status: Never Used  Substance Use Topics   Alcohol use: Yes    Comment: beer  every once in while   Drug use: No     Allergies   Patient has no known allergies.   Review of Systems Review of Systems  Constitutional:  Negative for fever.  Genitourinary:  Positive for dysuria, frequency and urgency. Negative for hematuria.  Musculoskeletal:  Positive for back pain.     Physical Exam Triage Vital Signs ED Triage Vitals  Encounter Vitals Group     BP      Girls Systolic BP Percentile      Girls Diastolic BP Percentile      Boys Systolic BP Percentile      Boys Diastolic BP Percentile      Pulse      Resp      Temp      Temp src      SpO2      Weight      Height      Head Circumference      Peak Flow      Pain Score      Pain Loc      Pain Education      Exclude from Growth Chart     No data found.  Updated Vital Signs BP (!) 156/79 (BP Location: Right Arm)   Pulse 72   Temp 98.4 F (36.9 C) (Oral)  Resp 16   Ht 5' 9 (1.753 m)   Wt 252 lb (114.3 kg)   SpO2 95%   BMI 37.21 kg/m   Visual Acuity Right Eye Distance:   Left Eye Distance:   Bilateral Distance:    Right Eye Near:   Left Eye Near:    Bilateral Near:     Physical Exam Vitals and nursing note reviewed.  Constitutional:      Appearance: Normal appearance. He is not ill-appearing.  HENT:     Head: Normocephalic and atraumatic.  Cardiovascular:     Rate and Rhythm: Normal rate and regular rhythm.     Pulses: Normal pulses.     Heart sounds: Normal heart sounds. No murmur heard.    No friction rub. No gallop.  Pulmonary:     Effort: Pulmonary effort is normal.     Breath sounds: Normal breath sounds. No wheezing, rhonchi or rales.  Abdominal:     Tenderness: There is no right CVA tenderness or left CVA tenderness.  Skin:    General: Skin is warm and dry.     Capillary Refill: Capillary refill takes less than 2 seconds.     Findings: No rash.  Neurological:     General: No focal deficit present.     Mental Status: He is alert and oriented to person, place, and time.      UC Treatments / Results  Labs (all labs ordered are listed, but only abnormal results are displayed) Labs Reviewed  URINALYSIS, W/ REFLEX TO CULTURE (INFECTION SUSPECTED) - Abnormal; Notable for the following components:      Result Value   Specific Gravity, Urine >1.030 (*)    Hgb urine dipstick MODERATE (*)    Bilirubin Urine SMALL (*)    Ketones, ur TRACE (*)    Protein, ur 30 (*)    Bacteria, UA MANY (*)    All other components within normal limits  URINE CULTURE    EKG   Radiology No results found.  Procedures Procedures (including critical care time)  Medications Ordered in UC Medications - No data to display  Initial Impression / Assessment and Plan / UC Course  I have reviewed the  triage vital signs and the nursing notes.  Pertinent labs & imaging results that were available during my care of the patient were reviewed by me and considered in my medical decision making (see chart for details).   Patient is a nontoxic-appearing 78 year old male presenting for evaluation of 5 days worth of UTI symptoms as outlined in HPI above.  He reports that he does have a history of UTIs but has been a while since he last had 1.  He has had low back pain with dysuria, urgency, and frequency.  No hematuria.  He does not have any CVA tenderness on exam.  I will order urinalysis to assess for the presence of UTI.  Urinalysis has a high specific gravity of >1.030, moderate hemoglobin with small bilirubin, trace ketones, 30 protein.  Negative for leukocyte esterase or nitrates.  Reflex microscopy shows 11-20 WBCs with 21-50 RBCs and many bacteria.  Urine will reflex to culture.  Given the lack of leukocyte esterase and nitrites I suspect the patient may actually be experiencing prostatitis.  I will discharge him home on Cipro  500 mg twice daily x 14 days for treatment of presumptive prostatitis.   Final Clinical Impressions(s) / UC Diagnoses   Final diagnoses:  Acute prostatitis     Discharge Instructions  Your urinalysis showed a significant number of white blood cells and red blood cells along with many bacteria but not the typical byproducts associated with a urinary tract infection.  I am suspicious she may actually be experiencing prostatitis.  Your urine is being sent for culture to see what bacteria grows out.  Take the Cipro  500 mg twice daily with food for the next 14 days for treatment of prostatitis.  You may use the Pyridium  every 8 hours as needed for urinary discomfort.  It will turn your urine to very deep red to orange.  If we need to make any adjustments to your antibiotics based upon your culture results we will contact you by phone.  If the Cipro  will treat the  infection that is present your results will appear in your MyChart.  If you do not have any improvement of your symptoms either return for reevaluation with your PCP.     ED Prescriptions     Medication Sig Dispense Auth. Provider   ciprofloxacin  (CIPRO ) 500 MG tablet Take 1 tablet (500 mg total) by mouth 2 (two) times daily for 14 days. 28 tablet Bernardino Ditch, NP   phenazopyridine  (PYRIDIUM ) 200 MG tablet Take 1 tablet (200 mg total) by mouth 3 (three) times daily. 6 tablet Bernardino Ditch, NP      PDMP not reviewed this encounter.   Bernardino Ditch, NP 08/14/24 [phone]

## 2024-08-15 LAB — URINE CULTURE: Culture: NO GROWTH

## 2024-11-06 ENCOUNTER — Other Ambulatory Visit: Payer: Self-pay | Admitting: Cardiovascular Disease

## 2024-12-05 ENCOUNTER — Encounter: Payer: Self-pay | Admitting: Cardiovascular Disease

## 2024-12-05 ENCOUNTER — Ambulatory Visit: Attending: Cardiology | Admitting: Cardiovascular Disease

## 2024-12-05 VITALS — BP 128/64 | HR 64 | Ht 70.0 in | Wt 252.0 lb

## 2024-12-05 DIAGNOSIS — I2510 Atherosclerotic heart disease of native coronary artery without angina pectoris: Secondary | ICD-10-CM | POA: Diagnosis not present

## 2024-12-05 DIAGNOSIS — E7800 Pure hypercholesterolemia, unspecified: Secondary | ICD-10-CM

## 2024-12-05 DIAGNOSIS — I10 Essential (primary) hypertension: Secondary | ICD-10-CM | POA: Diagnosis not present

## 2024-12-05 MED ORDER — LISINOPRIL 5 MG PO TABS: ORAL_TABLET | ORAL | 3 refills | Status: AC

## 2024-12-05 MED ORDER — METOPROLOL SUCCINATE ER 25 MG PO TB24: ORAL_TABLET | ORAL | 3 refills | Status: AC

## 2024-12-05 MED ORDER — CLOPIDOGREL BISULFATE 75 MG PO TABS: ORAL_TABLET | ORAL | 3 refills | Status: AC

## 2024-12-05 MED ORDER — SIMVASTATIN 20 MG PO TABS: 20.0000 mg | ORAL_TABLET | Freq: Every day | ORAL | 3 refills | Status: AC

## 2024-12-05 NOTE — Patient Instructions (Signed)

## 2024-12-05 NOTE — Assessment & Plan Note (Signed)
 History of CAD status post mid LAD CTO intervention by myself 06/14/2011 with subsequent negative Myoview  stress test.  He is active and asymptomatic.

## 2024-12-05 NOTE — Assessment & Plan Note (Signed)
 History of hyperlipidemia on statin therapy with recent lipid profile performed by his PCP 05/09/2024 revealing total cholesterol 116, LDL of 69 and HDL 31.

## 2024-12-05 NOTE — Assessment & Plan Note (Signed)
 History of essential hypertension with blood pressure measured today at 128/64.  He is on lisinopril  and metoprolol .

## 2024-12-05 NOTE — Progress Notes (Signed)
 12/05/2024 Darrell Jennings   March 20, 1946  [id number]  Primary Physician Darrell Amis, MD Primary Cardiologist: Darrell JINNY Lesches MD FACP, [place], [place], [place]  HPI:  Darrell Jennings is a 78 y.o.  moderately overweight divorced Caucasian male, father of 1, grandfather to 3 grandchildren, who I last saw in the office 12/05/2023. He was initially referred to me by Darrell Jennings at [facility] for treatment of an LAD CTO, which I successfully opened with a Resolute drug-eluting stent. Since that time, he has seen me back in followup. His other problems include hypertension and hyperlipidemia. His symptoms resolved after this intervention, and his Myoview  performed November 02, 2011, showed no ischemia. he had a Myoview  performed prior to extensive sinus surgery for preoperative clearance 11/07/13 which was normal. He does complain of occasional substernal chest pain with left upper extremity radiation. Darrell Jennings   He underwent elective total left total hip replacement in [facility] 11/09/16 and recuperated nicely. I did perform a Myoview  stress test on him 10/14/16 for preoperative clearance and this was entirely normal.    Since I saw him a year ago he is remained stable.  He still quite active walking his dog and doing yard work.  He denies chest pain or shortness of breath.   Current Meds  Medication Sig   aspirin  EC 81 MG tablet Take 81 mg by mouth daily.   B Complex-C (B-COMPLEX WITH VITAMIN C) tablet Take 1 tablet by mouth daily with lunch.   Coenzyme Q10 (CO Q 10) 100 MG CAPS Take 200 mg by mouth daily with lunch.   fish oil-omega-3 fatty acids 1000 MG capsule Take 1 g by mouth at bedtime.    Garlic 1000 MG CAPS Take 2,000 mg by mouth daily with lunch.   metFORMIN (GLUCOPHAGE-XR) 500 MG 24 hr tablet SMARTSIG:2 Tablet(s) By Mouth Every Evening   Multiple Vitamin (MULTIVITAMIN WITH MINERALS) TABS Take 1 tablet by mouth daily with lunch.    phenazopyridine  (PYRIDIUM ) 200 MG tablet Take 1 tablet  (200 mg total) by mouth 3 (three) times daily.   saw palmetto  160 MG capsule Take 160 mg by mouth daily with lunch.    Selenium  100 MCG TABS Take 1 tablet by mouth daily.   [DISCONTINUED] clopidogrel  (PLAVIX ) 75 MG tablet TAKE 1 TABLET (75 MG) BY MOUTH DAILY. Please keep scheduled appointment for future refills. Thank you.   [DISCONTINUED] lisinopril  (ZESTRIL ) 5 MG tablet TAKE 1 TABLET BY MOUTH EVERY DAY   [DISCONTINUED] metoprolol  succinate (TOPROL -XL) 25 MG 24 hr tablet TAKE 1 TABLET BY MOUTH DAILY. Please keep scheduled appointment for future refills. Thank you.   [DISCONTINUED] simvastatin  (ZOCOR ) 20 MG tablet Take 1 tablet (20 mg total) by mouth at bedtime. Please keep scheduled appointment for future refills. Thank you.     No Known Allergies  Social History   Socioeconomic History   Marital status: Single    Spouse name: Not on file   Number of children: 1   Years of education: Not on file   Highest education level: Not on file  Occupational History   Not on file  Tobacco Use   Smoking status: Former    Current packs/day: 0.00    Types: Cigarettes    Quit date: 12/27/1973    Years since quitting: 50.9    Passive exposure: Never   Smokeless tobacco: Never  Vaping Use   Vaping status: Never Used  Substance and Sexual Activity   Alcohol use: Yes  Comment: beer  every once in while   Drug use: No   Sexual activity: Not on file  Other Topics Concern   Not on file  Social History Narrative   Not on file   Social Drivers of Health   Financial Resource Strain: Low Risk  (11/27/2024)   Received from [facility]   Overall Financial Resource Strain (CARDIA)    Difficulty of Paying Living Expenses: Not hard at all  Food Insecurity: No Food Insecurity (11/27/2024)   Received from [facility]   Hunger Vital Sign    Within the past 12 months, you worried that your food would run out before you got the money to buy more.: Never true     Within the past 12 months, the food you bought just didn't last and you didn't have money to get more.: Never true  Transportation Needs: No Transportation Needs (11/27/2024)   Received from [facility] - [facility]    In the past 12 months, has lack of transportation kept you from medical appointments or from getting medications?: No    Lack of Transportation (Non-Medical): No  Physical Activity: Not on file  Stress: Not on file  Social Connections: Not on file  Intimate Partner Violence: Not on file     Review of Systems: General: negative for chills, fever, night sweats or weight changes.  Cardiovascular: negative for chest pain, dyspnea on exertion, edema, orthopnea, palpitations, paroxysmal nocturnal dyspnea or shortness of breath Dermatological: negative for rash Respiratory: negative for cough or wheezing Urologic: negative for hematuria Abdominal: negative for nausea, vomiting, diarrhea, bright red blood per rectum, melena, or hematemesis Neurologic: negative for visual changes, syncope, or dizziness All other systems reviewed and are otherwise negative except as noted above.    Blood pressure 128/64, pulse 64, height 5' 10 (1.778 m), weight 252 lb (114.3 kg), SpO2 94%.  General appearance: alert and no distress Neck: no adenopathy, no carotid bruit, no JVD, supple, symmetrical, trachea midline, and thyroid not enlarged, symmetric, no tenderness/mass/nodules Lungs: clear to auscultation bilaterally Heart: regular rate and rhythm, S1, S2 normal, no murmur, click, rub or gallop Extremities: extremities normal, atraumatic, no cyanosis or edema Pulses: 2+ and symmetric Skin: Skin color, texture, turgor normal. No rashes or lesions Neurologic: Grossly normal  EKG EKG Interpretation Date/Time:  Wednesday December 05 2024 09:29:16 EST Ventricular Rate:  69 PR Interval:  164 QRS Duration:  94 QT Interval:  386 QTC Calculation: 413 R  Axis:   -36  Text Interpretation: Normal sinus rhythm Left axis deviation Low voltage QRS Cannot rule out Anterior infarct (cited on or before 14-Jun-2011) When compared with ECG of 05-Dec-2023 11:18, QRS axis Shifted left Confirmed by Court Carrier [phone]) on 12/05/2024 9:53:20 AM    ASSESSMENT AND PLAN:   Essential hypertension History of essential hypertension with blood pressure measured today at 128/64.  He is on lisinopril  and metoprolol .  Coronary artery disease History of CAD status post mid LAD CTO intervention by myself 06/14/2011 with subsequent negative Myoview  stress test.  He is active and asymptomatic.  Pure hypercholesterolemia History of hyperlipidemia on statin therapy with recent lipid profile performed by his PCP 05/09/2024 revealing total cholesterol 116, LDL of 69 and HDL 31.     Carrier DOROTHA Court MD [facility], [facility] 12/05/2024 9:59 AM
